# Patient Record
Sex: Female | Born: 1980 | Race: White | Hispanic: No | Marital: Married | Smoking: Never smoker
Health system: Southern US, Community
[De-identification: ages and names within clinical notes are randomized; demographics above are authoritative.]

## PROBLEM LIST (undated history)

## (undated) DIAGNOSIS — E119 Type 2 diabetes mellitus without complications: Secondary | ICD-10-CM

## (undated) DIAGNOSIS — I10 Essential (primary) hypertension: Secondary | ICD-10-CM

## (undated) DIAGNOSIS — E039 Hypothyroidism, unspecified: Secondary | ICD-10-CM

## (undated) DIAGNOSIS — K219 Gastro-esophageal reflux disease without esophagitis: Secondary | ICD-10-CM

## (undated) DIAGNOSIS — N200 Calculus of kidney: Secondary | ICD-10-CM

## (undated) DIAGNOSIS — F419 Anxiety disorder, unspecified: Secondary | ICD-10-CM

## (undated) DIAGNOSIS — E079 Disorder of thyroid, unspecified: Secondary | ICD-10-CM

## (undated) HISTORY — PX: LITHOTRIPSY: SUR834

## (undated) HISTORY — PX: FOOT SURGERY: SHX648

---
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
## 2001-02-03 ENCOUNTER — Emergency Department (HOSPITAL_COMMUNITY): Admission: EM | Admit: 2001-02-03 | Discharge: 2001-02-03 | Payer: Self-pay | Admitting: Emergency Medicine

## 2001-02-03 ENCOUNTER — Encounter: Payer: Self-pay | Admitting: Emergency Medicine

## 2001-05-10 ENCOUNTER — Emergency Department (HOSPITAL_COMMUNITY): Admission: EM | Admit: 2001-05-10 | Discharge: 2001-05-10 | Payer: Self-pay | Admitting: Emergency Medicine

## 2001-06-23 ENCOUNTER — Encounter: Payer: Self-pay | Admitting: Emergency Medicine

## 2001-06-23 ENCOUNTER — Inpatient Hospital Stay (HOSPITAL_COMMUNITY): Admission: AC | Admit: 2001-06-23 | Discharge: 2001-07-01 | Payer: Self-pay

## 2001-07-08 ENCOUNTER — Encounter: Admission: RE | Admit: 2001-07-08 | Discharge: 2001-07-08 | Payer: Self-pay | Admitting: Neurosurgery

## 2001-07-08 ENCOUNTER — Encounter: Payer: Self-pay | Admitting: Neurosurgery

## 2001-07-18 ENCOUNTER — Encounter: Admission: RE | Admit: 2001-07-18 | Discharge: 2001-07-18 | Payer: Self-pay | Admitting: Neurosurgery

## 2001-07-18 ENCOUNTER — Encounter: Payer: Self-pay | Admitting: Neurosurgery

## 2001-09-19 ENCOUNTER — Encounter: Admission: RE | Admit: 2001-09-19 | Discharge: 2001-09-19 | Payer: Self-pay | Admitting: Neurosurgery

## 2001-09-19 ENCOUNTER — Encounter: Payer: Self-pay | Admitting: Neurosurgery

## 2003-11-18 ENCOUNTER — Emergency Department (HOSPITAL_COMMUNITY): Admission: EM | Admit: 2003-11-18 | Discharge: 2003-11-18 | Payer: Self-pay | Admitting: Emergency Medicine

## 2004-01-21 ENCOUNTER — Other Ambulatory Visit: Admission: RE | Admit: 2004-01-21 | Discharge: 2004-01-21 | Payer: Self-pay | Admitting: Obstetrics and Gynecology

## 2004-11-01 ENCOUNTER — Emergency Department (HOSPITAL_COMMUNITY): Admission: EM | Admit: 2004-11-01 | Discharge: 2004-11-01 | Payer: Self-pay | Admitting: Emergency Medicine

## 2005-06-22 ENCOUNTER — Other Ambulatory Visit: Admission: RE | Admit: 2005-06-22 | Discharge: 2005-06-22 | Payer: Self-pay | Admitting: Obstetrics and Gynecology

## 2006-04-13 ENCOUNTER — Emergency Department (HOSPITAL_COMMUNITY): Admission: EM | Admit: 2006-04-13 | Discharge: 2006-04-13 | Payer: Self-pay | Admitting: Emergency Medicine

## 2006-09-01 ENCOUNTER — Emergency Department (HOSPITAL_COMMUNITY): Admission: EM | Admit: 2006-09-01 | Discharge: 2006-09-01 | Payer: Self-pay | Admitting: Emergency Medicine

## 2006-09-18 ENCOUNTER — Emergency Department (HOSPITAL_COMMUNITY): Admission: EM | Admit: 2006-09-18 | Discharge: 2006-09-18 | Payer: Self-pay | Admitting: Emergency Medicine

## 2008-01-09 ENCOUNTER — Encounter: Admission: RE | Admit: 2008-01-09 | Discharge: 2008-01-09 | Payer: Self-pay | Admitting: Family Medicine

## 2008-03-27 ENCOUNTER — Emergency Department (HOSPITAL_COMMUNITY): Admission: EM | Admit: 2008-03-27 | Discharge: 2008-03-27 | Payer: Self-pay | Admitting: Emergency Medicine

## 2008-04-05 ENCOUNTER — Emergency Department (HOSPITAL_COMMUNITY): Admission: EM | Admit: 2008-04-05 | Discharge: 2008-04-05 | Payer: Self-pay | Admitting: Emergency Medicine

## 2008-04-19 ENCOUNTER — Ambulatory Visit (HOSPITAL_COMMUNITY): Admission: RE | Admit: 2008-04-19 | Discharge: 2008-04-19 | Payer: Self-pay | Admitting: Urology

## 2009-10-30 IMAGING — CT CT PELVIS W/O CM
1 of 2 series · 13 of 32 positions shown, 19 images · non-contrast
Comparison: 03/27/2008

CT ABDOMEN

CLINICAL DATA: Worsening right flank pain.  Nausea.  Hematuria.
Recent ureteral calculus.

CT ABDOMEN AND PELVIS WITHOUT CONTRAST
TECHNIQUE: Multidetector CT imaging of the abdomen and pelvis was
performed following the standard protocol without intravenous
contrast.

[Series 4: 220 stone 4.0 b70f st · axial · 0.68mm/px · z∈[-468,-68]mm · 13 of 94 slices shown, 19 images]
[im 7/94  soft-tissue]
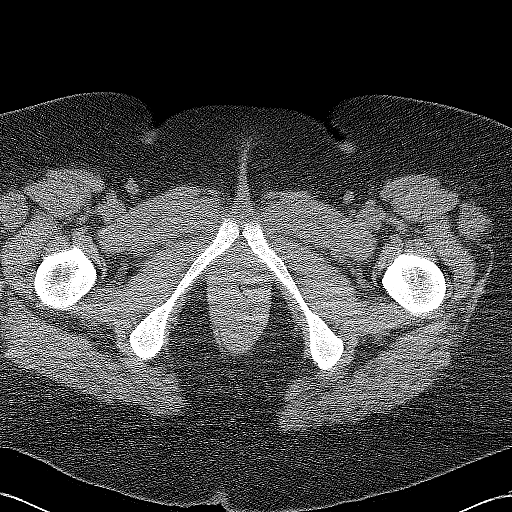
[im 7/94  bone]
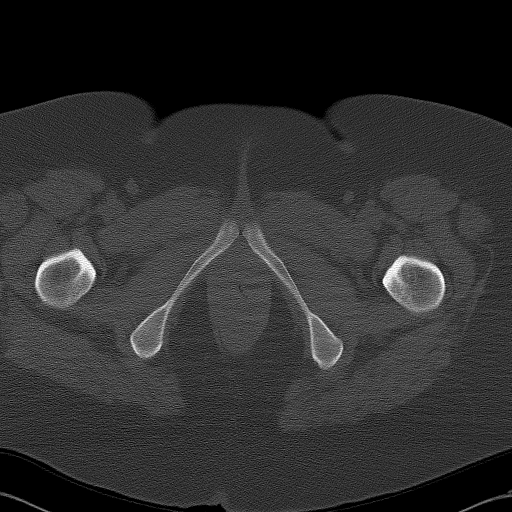
[im 13/94  soft-tissue]
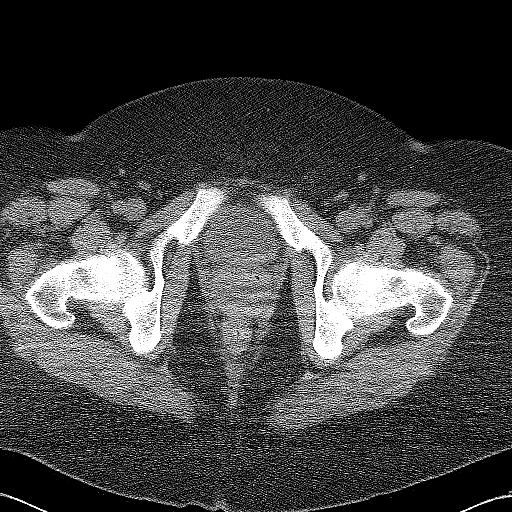
[im 19/94  soft-tissue]
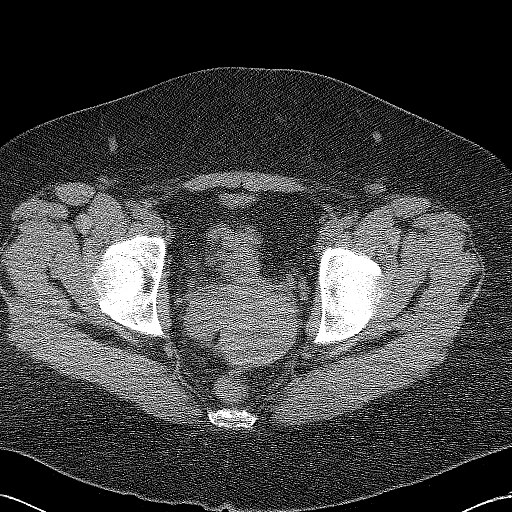
[im 25/94  soft-tissue]
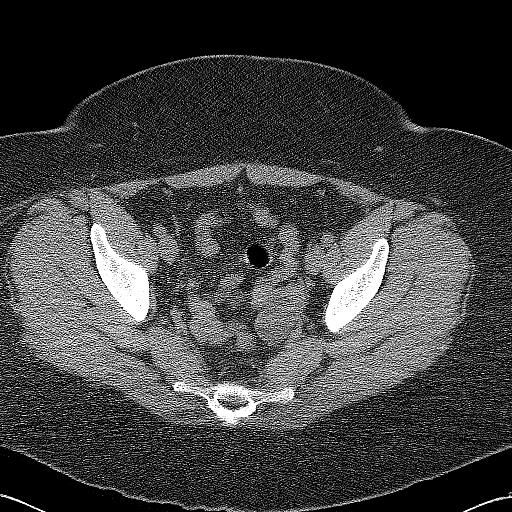
[im 32/94  soft-tissue]
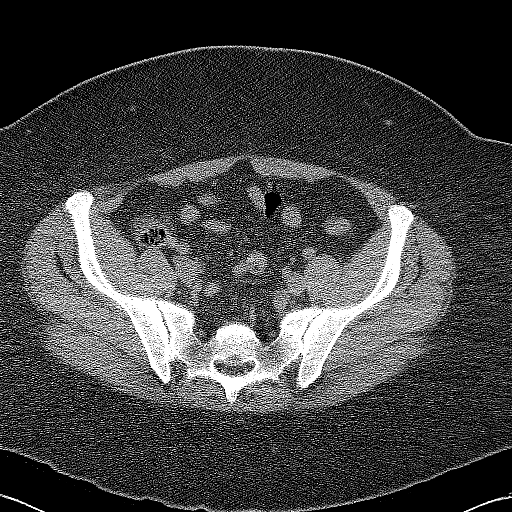
[im 38/94  soft-tissue]
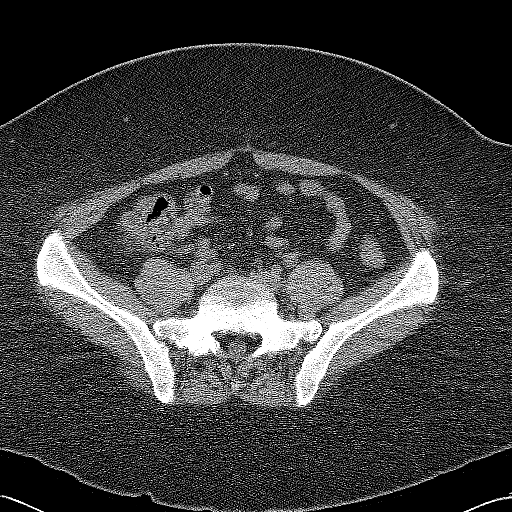
[im 50/94  soft-tissue]
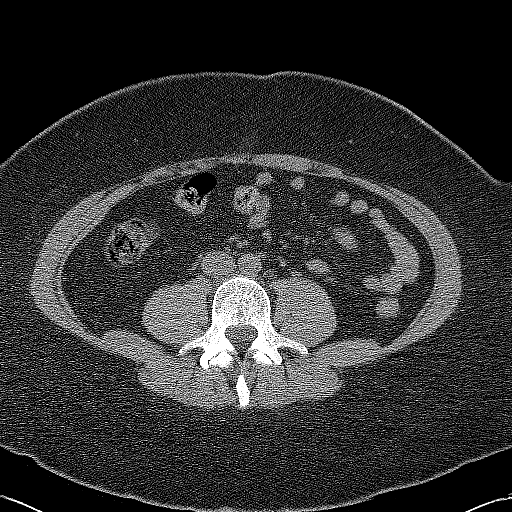
[im 56/94  soft-tissue]
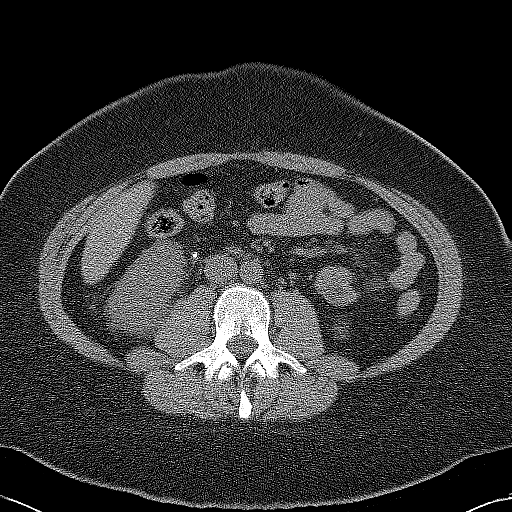
[im 63/94  soft-tissue]
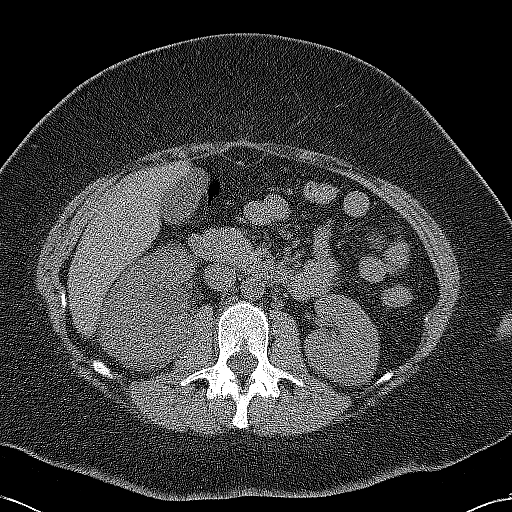
[im 63/94  bone]
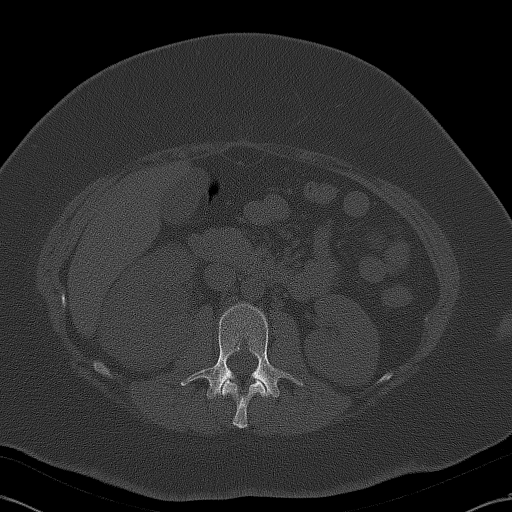
[im 69/94  soft-tissue]
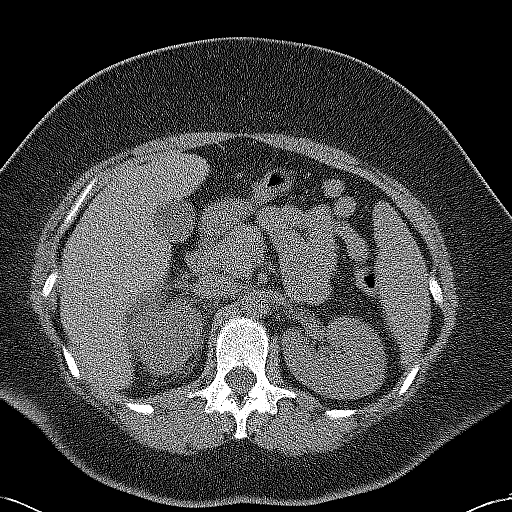
[im 69/94  lung]
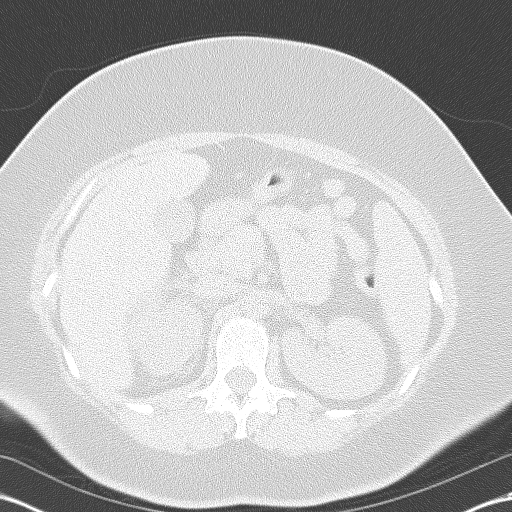
[im 75/94  soft-tissue]
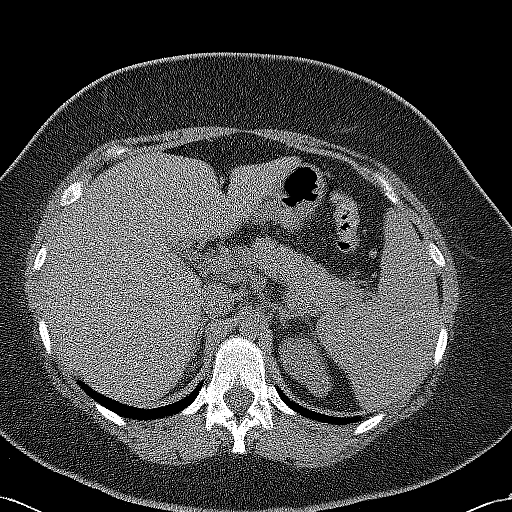
[im 75/94  lung]
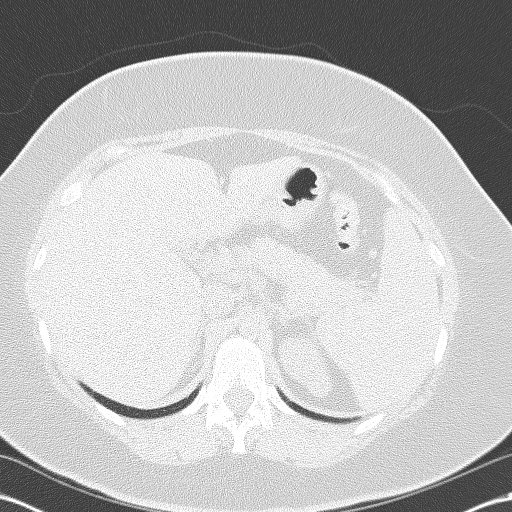
[im 81/94  soft-tissue]
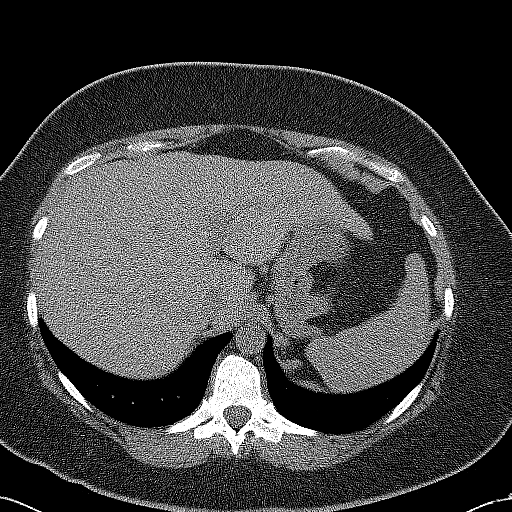
[im 81/94  lung]
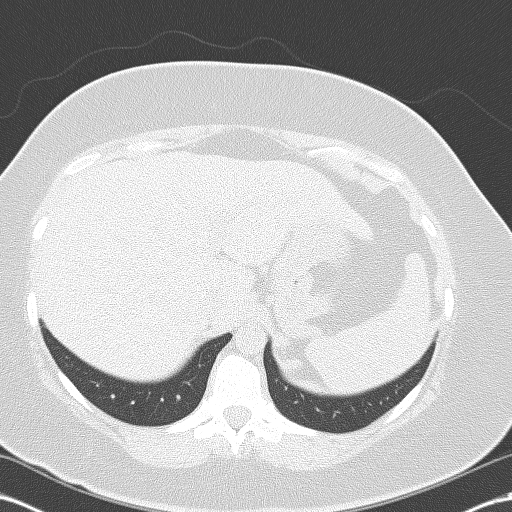
[im 87/94  soft-tissue]
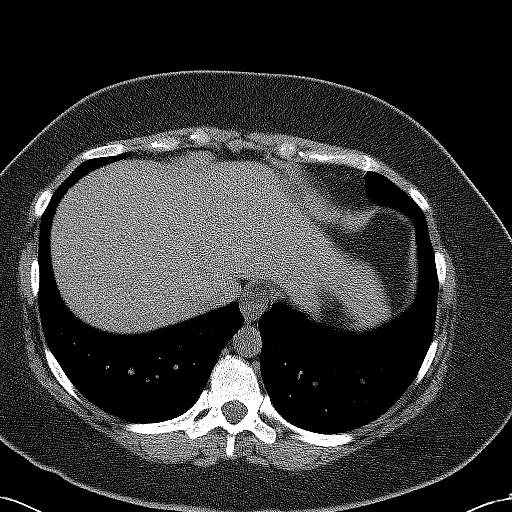
[im 87/94  lung]
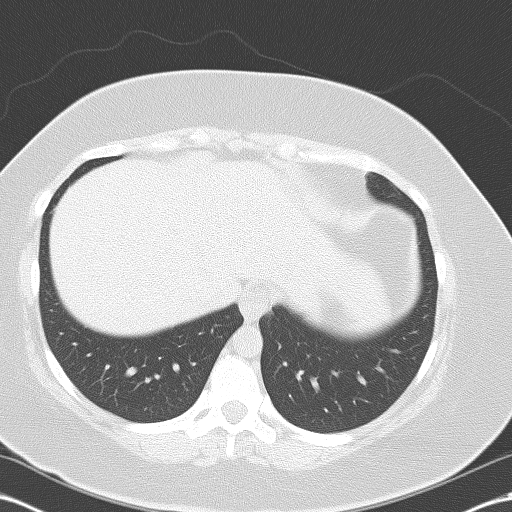

[13 of 32 positions shown; findings below may reference images not displayed]

FINDINGS: 3 mm calculus is again seen at the right ureteropelvic
junction.  Mild to moderate right sided pelvicaliectasis has not
significantly changed, although there is a new mild right
perinephric stranding.  No other urinary calculi are identified.

Noncontrast images of the other abdominal parenchymal organs are
unremarkable in appearance.
IMPRESSION: Persistent 3 mm calculus at the right UPJ.  Stable mild to moderate
right hydronephrosis, although increased right perinephric
stranding is noted.

CT PELVIS
FINDINGS: There is no evidence of distal ureteral calculi.  2.5 cm
left ovarian cyst is mildly increased in size since prior study and
likely physiologic.  There is no evidence of abscess or
inflammatory process within pelvis.  No abnormal fluid collections
are seen.
IMPRESSION: 2.5 cm left ovarian cyst is mildly increased in size since prior
study 9 days ago, and is likely physiologic.

## 2010-01-01 ENCOUNTER — Ambulatory Visit: Payer: Self-pay | Admitting: Obstetrics and Gynecology

## 2010-01-08 ENCOUNTER — Ambulatory Visit: Payer: Self-pay | Admitting: Obstetrics and Gynecology

## 2010-01-15 ENCOUNTER — Ambulatory Visit: Payer: Self-pay | Admitting: Obstetrics & Gynecology

## 2010-01-21 ENCOUNTER — Ambulatory Visit: Payer: Self-pay | Admitting: Obstetrics and Gynecology

## 2010-02-18 ENCOUNTER — Inpatient Hospital Stay (HOSPITAL_COMMUNITY): Admission: RE | Admit: 2010-02-18 | Discharge: 2010-02-21 | Payer: Self-pay | Admitting: Obstetrics and Gynecology

## 2010-02-18 ENCOUNTER — Encounter (HOSPITAL_COMMUNITY): Payer: Self-pay | Admitting: Obstetrics and Gynecology

## 2010-02-21 ENCOUNTER — Encounter
Admission: RE | Admit: 2010-02-21 | Discharge: 2010-03-10 | Payer: Self-pay | Source: Home / Self Care | Admitting: Obstetrics and Gynecology

## 2010-06-24 LAB — GLUCOSE, CAPILLARY
Glucose-Capillary: 100 mg/dL — ABNORMAL HIGH (ref 70–99)
Glucose-Capillary: 111 mg/dL — ABNORMAL HIGH (ref 70–99)
Glucose-Capillary: 120 mg/dL — ABNORMAL HIGH (ref 70–99)
Glucose-Capillary: 121 mg/dL — ABNORMAL HIGH (ref 70–99)
Glucose-Capillary: 127 mg/dL — ABNORMAL HIGH (ref 70–99)
Glucose-Capillary: 81 mg/dL (ref 70–99)
Glucose-Capillary: 84 mg/dL (ref 70–99)

## 2010-06-24 LAB — CBC
HCT: 29.8 % — ABNORMAL LOW (ref 36.0–46.0)
Hemoglobin: 10.1 g/dL — ABNORMAL LOW (ref 12.0–15.0)
Hemoglobin: 12.2 g/dL (ref 12.0–15.0)
MCH: 27.7 pg (ref 26.0–34.0)
MCHC: 33.7 g/dL (ref 30.0–36.0)
MCV: 82.1 fL (ref 78.0–100.0)
RBC: 3.62 MIL/uL — ABNORMAL LOW (ref 3.87–5.11)
RBC: 4.42 MIL/uL (ref 3.87–5.11)

## 2010-06-24 LAB — TYPE AND SCREEN: ABO/RH(D): O POS

## 2010-06-24 LAB — SURGICAL PCR SCREEN
MRSA, PCR: NEGATIVE
Staphylococcus aureus: NEGATIVE

## 2010-07-28 LAB — GLUCOSE, CAPILLARY: Glucose-Capillary: 216 mg/dL — ABNORMAL HIGH (ref 70–99)
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
## 2011-01-16 LAB — URINALYSIS, ROUTINE W REFLEX MICROSCOPIC
Bilirubin Urine: NEGATIVE
Glucose, UA: NEGATIVE mg/dL
Ketones, ur: NEGATIVE mg/dL
Leukocytes, UA: NEGATIVE
Nitrite: NEGATIVE
Protein, ur: 30 mg/dL — AB
Protein, ur: NEGATIVE mg/dL
Specific Gravity, Urine: 1.02 (ref 1.005–1.030)
Urobilinogen, UA: 0.2 mg/dL (ref 0.0–1.0)
pH: 5.5 (ref 5.0–8.0)

## 2011-01-16 LAB — CBC
HCT: 41.5 % (ref 36.0–46.0)
MCHC: 33.5 g/dL (ref 30.0–36.0)
MCV: 82.7 fL (ref 78.0–100.0)
Platelets: 245 10*3/uL (ref 150–400)
WBC: 10.7 10*3/uL — ABNORMAL HIGH (ref 4.0–10.5)

## 2011-01-16 LAB — DIFFERENTIAL
Basophils Relative: 0 % (ref 0–1)
Eosinophils Absolute: 0 10*3/uL (ref 0.0–0.7)
Eosinophils Relative: 0 % (ref 0–5)
Lymphs Abs: 1.3 10*3/uL (ref 0.7–4.0)
Neutrophils Relative %: 82 % — ABNORMAL HIGH (ref 43–77)

## 2011-01-16 LAB — URINE MICROSCOPIC-ADD ON

## 2011-01-16 LAB — URINE CULTURE: Colony Count: 10000

## 2011-01-16 LAB — BASIC METABOLIC PANEL
CO2: 25 mEq/L (ref 19–32)
Calcium: 9.6 mg/dL (ref 8.4–10.5)
Creatinine, Ser: 0.9 mg/dL (ref 0.4–1.2)
GFR calc Af Amer: >60 mL/min (ref >60)
GFR calc non Af Amer: >60 mL/min (ref >60)
Glucose, Bld: 158 mg/dL — ABNORMAL HIGH (ref 70–99)
Sodium: 139 mEq/L (ref 135–145)

## 2011-01-29 LAB — I-STAT 8, (EC8 V) (CONVERTED LAB)
BUN: 7
Glucose, Bld: 105 — ABNORMAL HIGH
Hemoglobin: 13.6
Potassium: 4.2
Sodium: 141
pH, Ven: 7.339 — ABNORMAL HIGH

## 2011-01-29 LAB — D-DIMER, QUANTITATIVE: D-Dimer, Quant: <0.22

## 2011-01-29 LAB — POCT I-STAT CREATININE
Creatinine, Ser: 0.7
Operator id: 151321

## 2011-01-29 LAB — POCT CARDIAC MARKERS: Myoglobin, poc: 57.7

## 2014-12-07 ENCOUNTER — Other Ambulatory Visit: Payer: Self-pay | Admitting: Obstetrics and Gynecology

## 2014-12-10 LAB — CYTOLOGY - PAP

## 2016-07-19 ENCOUNTER — Emergency Department
Admission: EM | Admit: 2016-07-19 | Discharge: 2016-07-19 | Disposition: A | Discharge status: Home / Self Care | Payer: Managed Care, Other (non HMO) | Source: Home / Self Care | Attending: Family Medicine | Admitting: Family Medicine

## 2016-07-19 ENCOUNTER — Encounter: Payer: Self-pay | Admitting: Emergency Medicine

## 2016-07-19 ENCOUNTER — Ambulatory Visit (HOSPITAL_BASED_OUTPATIENT_CLINIC_OR_DEPARTMENT_OTHER)
Admission: RE | Admit: 2016-07-19 | Discharge: 2016-07-19 | Disposition: A | Discharge status: Home / Self Care | Payer: Managed Care, Other (non HMO) | Source: Ambulatory Visit | Attending: Family Medicine | Admitting: Family Medicine

## 2016-07-19 DIAGNOSIS — S8991XA Unspecified injury of right lower leg, initial encounter: Secondary | ICD-10-CM | POA: Diagnosis not present

## 2016-07-19 DIAGNOSIS — M7989 Other specified soft tissue disorders: Secondary | ICD-10-CM | POA: Diagnosis present

## 2016-07-19 DIAGNOSIS — M79661 Pain in right lower leg: Secondary | ICD-10-CM | POA: Diagnosis present

## 2016-07-19 DIAGNOSIS — S8011XA Contusion of right lower leg, initial encounter: Secondary | ICD-10-CM

## 2016-07-19 HISTORY — DX: Calculus of kidney: N20.0

## 2016-07-19 HISTORY — DX: Essential (primary) hypertension: I10

## 2016-07-19 HISTORY — DX: Disorder of thyroid, unspecified: E07.9

## 2016-07-19 HISTORY — DX: Type 2 diabetes mellitus without complications: E11.9

## 2016-07-19 NOTE — ED Provider Notes (Signed)
CSN: [id number]     Arrival date & time 07/19/16  1613 History   First MD Initiated Contact with Patient 07/19/16 1635     Chief Complaint  Patient presents with  . Fall   (Consider location/radiation/quality/duration/timing/severity/associated sxs/prior Treatment) HPI  Donna Marks is a 36 y.o. female presenting to UC with c/o gradually worsening swelling and bruising from fall on floor 1 week ago while she was in [place], [place]. Pt was seen at an UC at time of incident and had negative x-rays but pain and swelling have worsened.  Pt concerned for possible blood clot. No prior hx of blood clots. She is on a baby aspirin daily but no other blood thinners.  Denies chest pain or SOB.     Past Medical History:  Diagnosis Date  . Diabetes mellitus without complication (HCC)   . Hypertension   . Kidney stones   . Thyroid disease    Past Surgical History:  Procedure Laterality Date  . FOOT SURGERY     Foreign Body Removal    History reviewed. No pertinent family history. Social History  Substance Use Topics  . Smoking status: Never Smoker  . Smokeless tobacco: Never Used  . Alcohol use No   OB History    No data available     Review of Systems  Constitutional: Negative for chills and fever.  Respiratory: Negative for shortness of breath and wheezing.   Cardiovascular: Positive for leg swelling. Negative for chest pain and palpitations.  Musculoskeletal: Positive for arthralgias and myalgias. Negative for joint swelling.  Skin: Positive for color change. Negative for wound.    Allergies  Amoxil [amoxicillin]; Ceftin [cefuroxime axetil]; and Codeine  Home Medications   Prior to Admission medications   Medication Sig Start Date End Date Taking? Authorizing Provider  aspirin 81 MG chewable tablet Chew by mouth daily.   Yes Historical Provider, MD  citalopram (CELEXA) 10 MG tablet Take 10 mg by mouth daily.   Yes Historical Provider, MD  levothyroxine (SYNTHROID, LEVOTHROID) 200  MCG tablet Take 225 mcg by mouth daily before breakfast.   Yes Historical Provider, MD  lisinopril (PRINIVIL,ZESTRIL) 10 MG tablet Take 10 mg by mouth daily.   Yes Historical Provider, MD  metFORMIN (GLUCOPHAGE) 1000 MG tablet Take 1,000 mg by mouth 2 (two) times daily with a meal.   Yes Historical Provider, MD  metoprolol succinate (TOPROL-XL) 50 MG 24 hr tablet Take 50 mg by mouth daily. Take with or immediately following a meal.   Yes Historical Provider, MD  norgestimate-ethinyl estradiol (ORTHO-CYCLEN,SPRINTEC,PREVIFEM) 0.25-35 MG-MCG tablet Take 1 tablet by mouth daily.   Yes Historical Provider, MD   Meds Ordered and Administered this Visit  Medications - No data to display  BP (!) 132/95 (BP Location: Left Arm)   Pulse 76   Temp 98.2 F (36.8 C) (Oral)   Resp 16   Ht  (1.702 m)   Wt 294 lb 8 oz (133.6 kg)   LMP 07/15/2016   SpO2 95%   BMI 46.13 kg/m  No data found.   Physical Exam  Constitutional: She is oriented to person, place, and time. She appears well-developed and well-nourished.  HENT:  Head: Normocephalic and atraumatic.  Eyes: EOM are normal.  Neck: Normal range of motion.  Cardiovascular: Normal rate.   Pulmonary/Chest: Effort normal.  Musculoskeletal: Normal range of motion. She exhibits edema and tenderness.  Right knee and lower leg: mild edema compared to Left. Tenderness to knee and anterior lower  leg. Minimal tenderness to lateral aspect. No tenderness to posterior knee or calf. Muscle compartments are soft.  Neurological: She is alert and oriented to person, place, and time.  Skin: Skin is warm and dry.  Right knee and lower leg: significant ecchymosis starting in anterior and medial knee moving down into lower leg.   Psychiatric: She has a normal mood and affect. Her behavior is normal.  Nursing note and vitals reviewed.   Urgent Care Course     Procedures (including critical care time)  Labs Review Labs Reviewed - No data to  display  Imaging Review US Venous Img Lower Unilateral Right  Result Date: 07/19/2016 CLINICAL DATA:  Right knee pain and swelling post fall 6 days ago. EXAM: Right LOWER EXTREMITY VENOUS DOPPLER ULTRASOUND TECHNIQUE: Gray-scale sonography with graded compression, as well as color Doppler and duplex ultrasound were performed to evaluate the lower extremity deep venous systems from the level of the common femoral vein and including the common femoral, femoral, profunda femoral, popliteal and calf veins including the posterior tibial, peroneal and gastrocnemius veins when visible. The superficial great saphenous vein was also interrogated. Spectral Doppler was utilized to evaluate flow at rest and with distal augmentation maneuvers in the common femoral, femoral and popliteal veins. COMPARISON:  None. FINDINGS: Contralateral Common Femoral Vein: Respiratory phasicity is normal and symmetric with the symptomatic side. No evidence of thrombus. Normal compressibility. Common Femoral Vein: No evidence of thrombus. Normal compressibility, respiratory phasicity and response to augmentation. Saphenofemoral Junction: No evidence of thrombus. Normal compressibility and flow on color Doppler imaging. Profunda Femoral Vein: No evidence of thrombus. Normal compressibility and flow on color Doppler imaging. Femoral Vein: No evidence of thrombus. Normal compressibility, respiratory phasicity and response to augmentation. Popliteal Vein: No evidence of thrombus. Normal compressibility, respiratory phasicity and response to augmentation. Calf Veins: No evidence of thrombus. Normal compressibility and flow on color Doppler imaging. Superficial Great Saphenous Vein: No evidence of thrombus. Normal compressibility and flow on color Doppler imaging. Venous Reflux:  None. Other Findings: No focal fluid collection over the area of patient's anterior right knee pain/bruising. IMPRESSION: No evidence of deep venous thrombosis.  Electronically Signed   By: Elberta Fortis M.D.   On: 07/19/2016 17:31     MDM   1. Right leg injury, initial encounter   2. Traumatic ecchymosis of right lower leg, initial encounter   3. Pain and swelling of right lower leg    Pt sent to [facility] for U/S, however, low concern for DVT given no posterior leg tenderness. Soft muscle compartments.   Significant bruising could be due to a combination of pt being fair skin, gravity, and possible underlying soft tissue injury such as ligament or meniscus.    U/S RLE: Negative for DVT  Encouraged rest, warm compresses to help with bruising and circulation.  Elevation to help with swelling. f/u with PCP as needed.      Junius Finner, PA-C 07/19/16 1740

## 2016-07-19 NOTE — ED Triage Notes (Signed)
Patient presents to [facility] with C/O pain , swelling and bruising after a fall on Monday. Patient states she was seen at an urgent care on Monday for an x-ray. She said negative for a fracture. brusing is worse and pain continues.

## 2018-12-31 IMAGING — US US EXTREM LOW VENOUS*R*
1 series · 13 of 24 positions shown · non-contrast
Comparison: None.

CLINICAL DATA: Right knee pain and swelling post fall 6 days ago.



[Series 1: us extrem low venous*right* · 0.10mm/px · 13 of 38 slices shown]
[im 1/38]
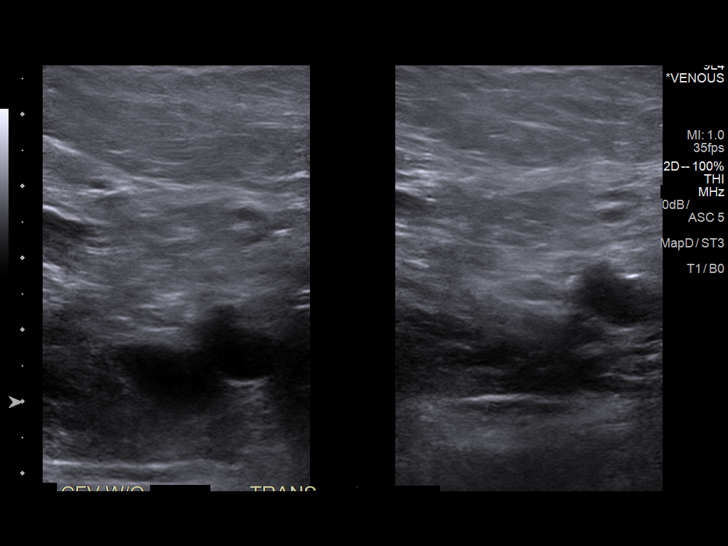
[im 4/38]
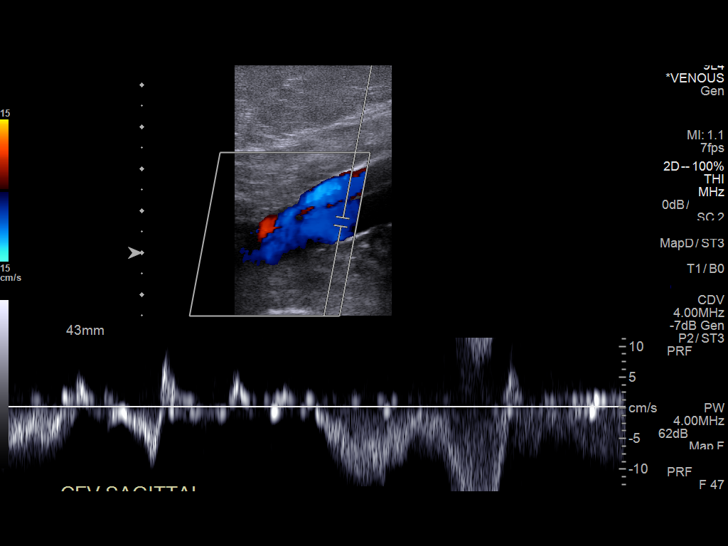
[im 7/38]
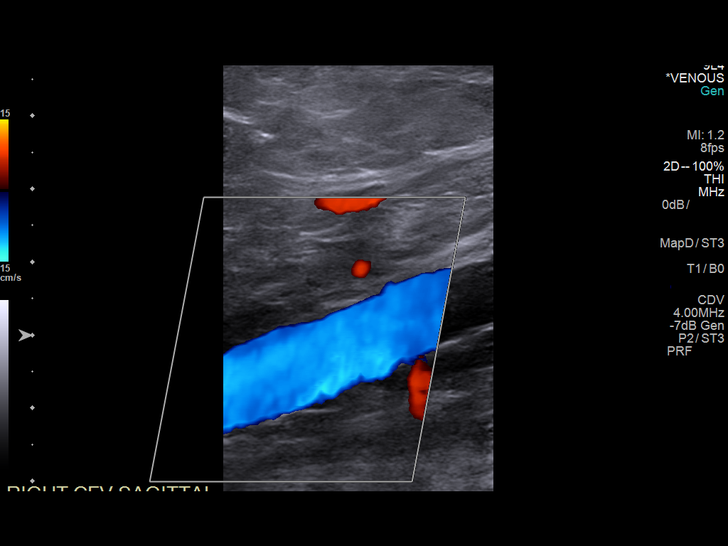
[im 10/38]
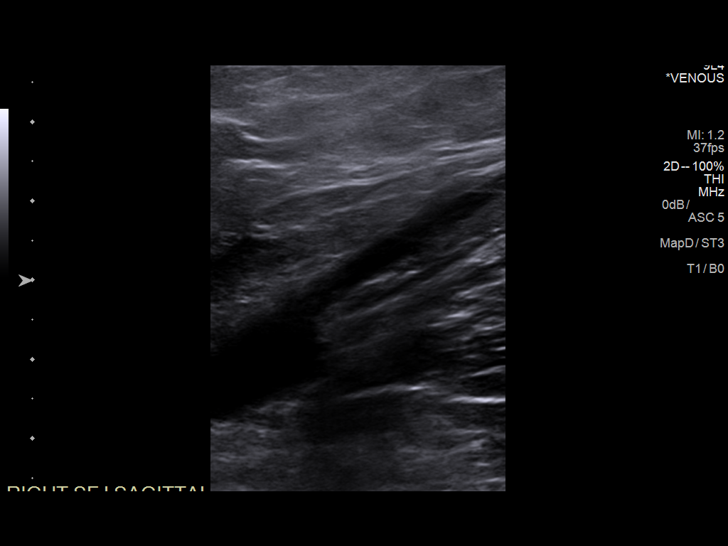
[im 13/38]
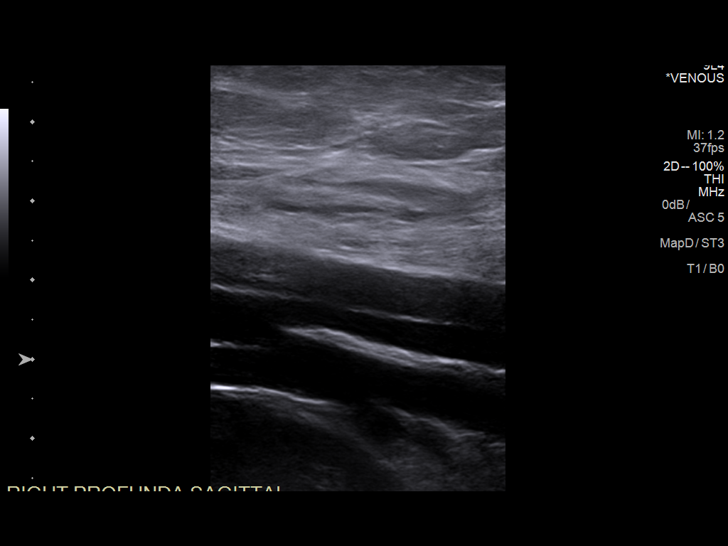
[im 17/38]
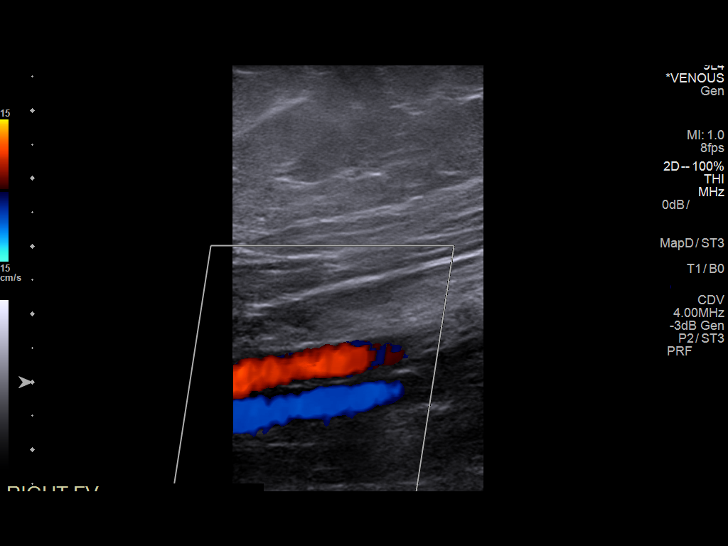
[im 20/38]
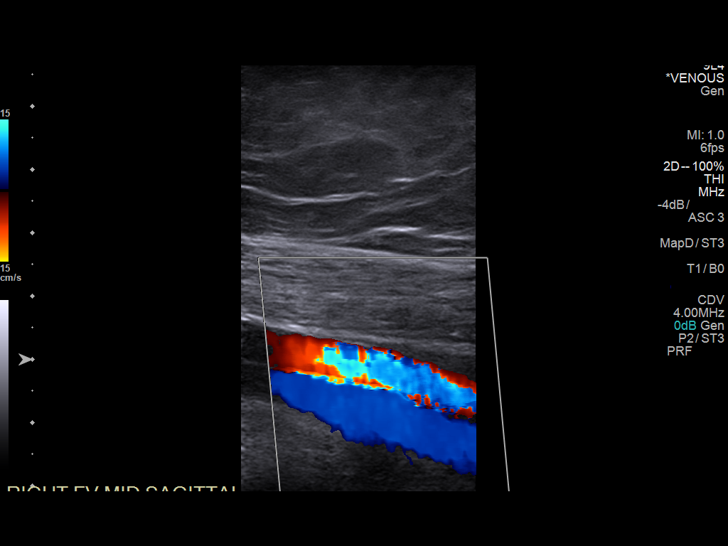
[im 21/38]
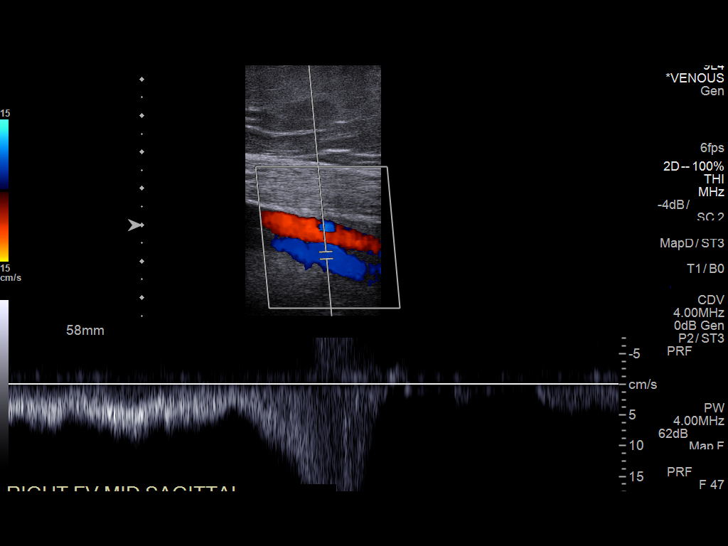
[im 25/38]
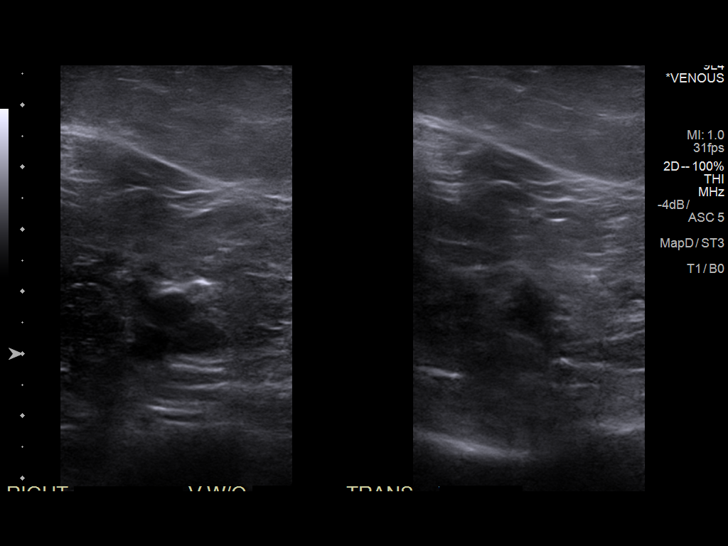
[im 28/38]
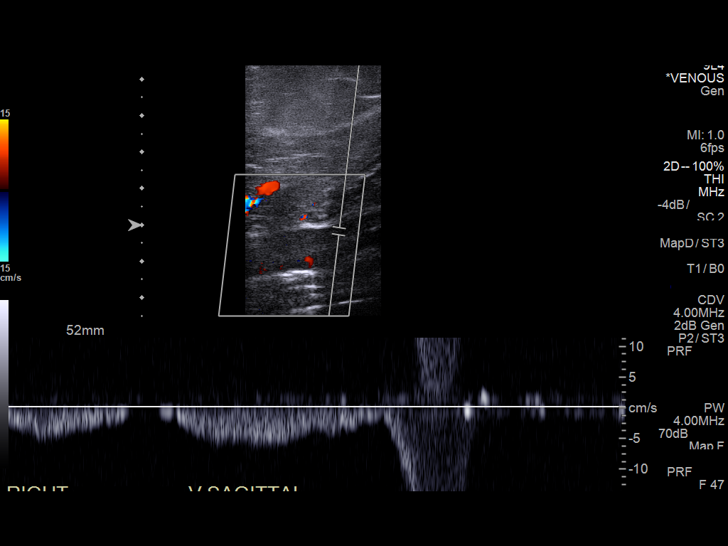
[im 31/38]
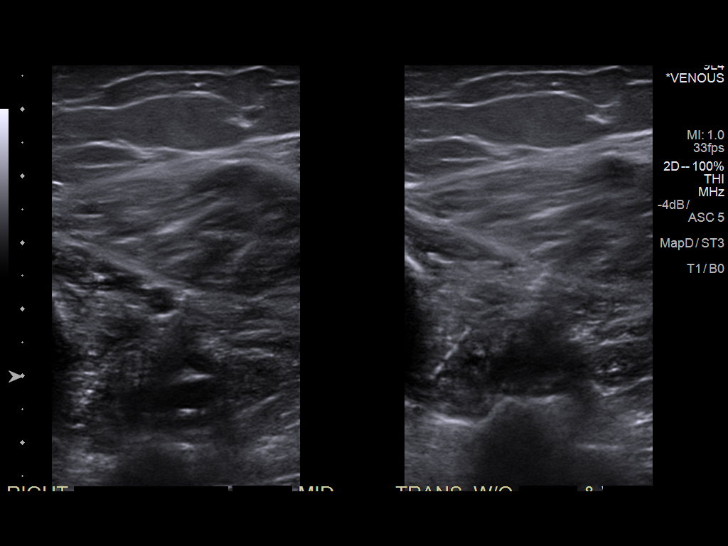
[im 34/38]
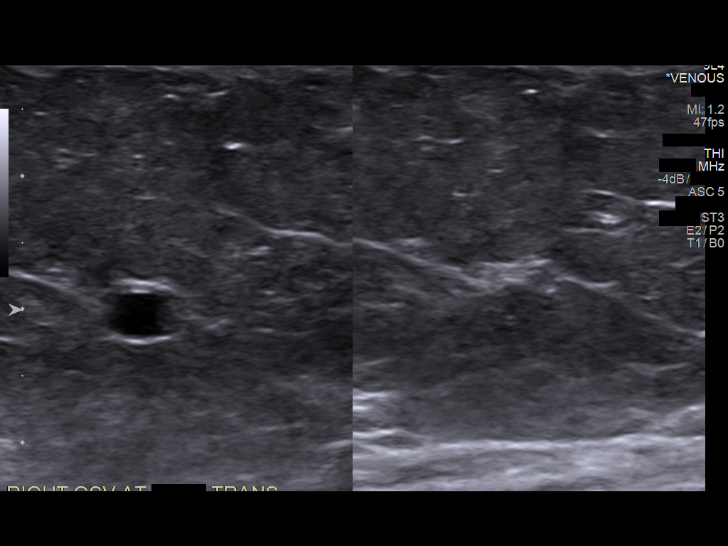
[im 38/38]
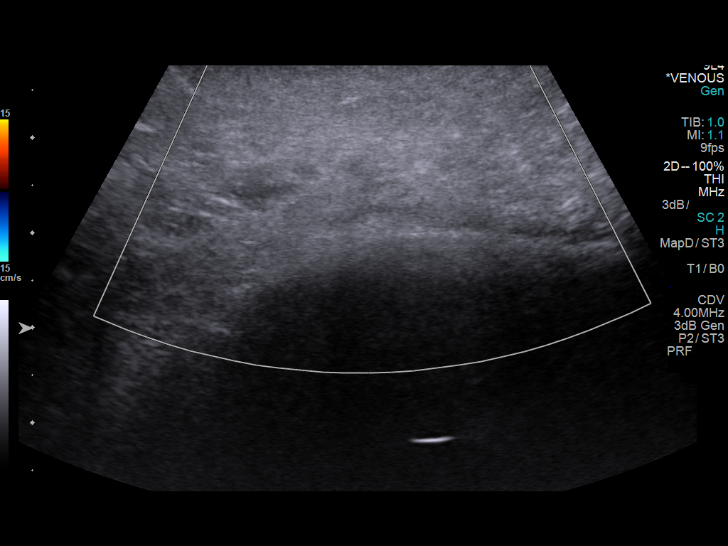

[13 of 24 positions shown; findings below may reference images not displayed]

FINDINGS: Contralateral Common Femoral Vein: Respiratory phasicity is normal
and symmetric with the symptomatic side. No evidence of thrombus.
Normal compressibility.

Common Femoral Vein: No evidence of thrombus. Normal
compressibility, respiratory phasicity and response to augmentation.

Saphenofemoral Junction: No evidence of thrombus. Normal
compressibility and flow on color Doppler imaging.

Profunda Femoral Vein: No evidence of thrombus. Normal
compressibility and flow on color Doppler imaging.

Femoral Vein: No evidence of thrombus. Normal compressibility,
respiratory phasicity and response to augmentation.

Popliteal Vein: No evidence of thrombus. Normal compressibility,
respiratory phasicity and response to augmentation.

Calf Veins: No evidence of thrombus. Normal compressibility and flow
on color Doppler imaging.

Superficial Great Saphenous Vein: No evidence of thrombus. Normal
compressibility and flow on color Doppler imaging.

Venous Reflux:  None.

Other Findings: No focal fluid collection over the area of patient's
anterior right knee pain/bruising.
IMPRESSION: No evidence of deep venous thrombosis.

## 2019-02-02 LAB — OB RESULTS CONSOLE ABO/RH: RH Type: POSITIVE

## 2019-02-02 LAB — OB RESULTS CONSOLE RPR: RPR: NONREACTIVE

## 2019-02-02 LAB — OB RESULTS CONSOLE ANTIBODY SCREEN: Antibody Screen: NEGATIVE

## 2019-02-02 LAB — OB RESULTS CONSOLE RUBELLA ANTIBODY, IGM: Rubella: IMMUNE

## 2019-02-02 LAB — OB RESULTS CONSOLE HIV ANTIBODY (ROUTINE TESTING): HIV: NONREACTIVE

## 2019-02-02 LAB — OB RESULTS CONSOLE GC/CHLAMYDIA
Chlamydia: NEGATIVE
Gonorrhea: NEGATIVE

## 2019-02-02 LAB — OB RESULTS CONSOLE HEPATITIS B SURFACE ANTIGEN: Hepatitis B Surface Ag: NEGATIVE

## 2019-08-07 NOTE — H&P (Signed)
Donna Marks is a 39 y.o. female presenting for scheduled repeat cesarean section with bilateral tubal ligation. Pregnancy c/b morbid obesity, thyroid disease, cHTN, and T2DM. She has suspected macrosomia. DM did have very poor control until re-establishing care with endocrinology at that time BS was improved. Last [place] at 46 wga demonstrated EFW of 100% of 8#1.HTN c/w Labetalol 200mg  BID and has been well controlled. Thyroid disease d/w Levothyroxine 250 mcg.  She has been counseled regarding risks of all of the above. Expecting a boy.    OB History   No obstetric history on file.    Past Medical History:  Diagnosis Date  . Diabetes mellitus without complication (HCC)   . Hypertension   . Kidney stones   . Thyroid disease    Past Surgical History:  Procedure Laterality Date  . FOOT SURGERY     Foreign Body Removal   Cesarean section  Family History: family history is not on file. Social History:  reports that she has never smoked. She has never used smokeless tobacco. She reports that she does not drink alcohol. No history on file for drug.     Maternal Diabetes: Yes:  Diabetes Type:  Insulin/Medication controlled Genetic Screening: Normal Maternal Ultrasounds/Referrals: Other: Fetal Ultrasounds or other Referrals:  Fetal echo Maternal Substance Abuse:  No Significant Maternal Medications:  Meds include: Other: labetalol, levothyroxine, metformin, and insulin Significant Maternal Lab Results:  None Other Comments:  None  Review of Systems History   There were no vitals taken for this visit. Exam Physical Exam  (from office) NAD, A&O NWOB Abd soft, nondistended, gravid   Prenatal labs: ABO, Rh:   Antibody:   Rubella:   RPR:    HBsAg:    HIV:    GBS:     Assessment/Plan:  38 yo presenting for scheduled repeat CS with requested bilateral tubal ligation. Risks discussed including infection, bleeding, damage to surrounding structures, the need for additional  procedures including hysterectomy, and the possibility of uterine rupture with neonatal morbidity/mortality, scarring, and abnormal placentation with subsequent pregnancies. We also discussed bilateral tubal ligation in detail. The alternatives to permanent sterilization were reviewed and it was discussed that this is considered permanent. We reviewed the risks in detail including regret, failure and ectopic pregnancy. She understands and agrees to proceed. Gent/xlinda on call to OR.      26 08/07/2019, 1:59 PM

## 2019-08-17 ENCOUNTER — Encounter (HOSPITAL_COMMUNITY): Payer: Self-pay | Admitting: *Deleted

## 2019-08-17 ENCOUNTER — Telehealth (HOSPITAL_COMMUNITY): Payer: Self-pay | Admitting: *Deleted

## 2019-08-17 NOTE — Telephone Encounter (Signed)
Preadmission screen  

## 2019-08-21 ENCOUNTER — Encounter (HOSPITAL_COMMUNITY): Payer: Self-pay

## 2019-08-21 ENCOUNTER — Telehealth (HOSPITAL_COMMUNITY): Payer: Self-pay | Admitting: *Deleted

## 2019-08-21 NOTE — Telephone Encounter (Signed)
Preadmission screen  

## 2019-08-22 ENCOUNTER — Encounter (HOSPITAL_COMMUNITY): Payer: Self-pay

## 2019-08-22 NOTE — Patient Instructions (Signed)
ANNAELLE KASEL  08/22/2019   Your procedure is scheduled on:  08/31/2019  Arrive at 0530 at Entrance C on [address] at [facility]  and [address]. You are invited to use the FREE valet parking or use the Visitor's parking deck.  Pick up the phone at the desk and dial [phone].  Call this number if you have problems the morning of surgery: [phone]  Remember:   Do not eat food:(After Midnight) Desps de medianoche.  Do not drink clear liquids: (After Midnight) Desps de medianoche.  Take these medicines the morning of surgery with A SIP OF WATER:  Do not take metformin the day before or day of surgery            Take 35units of insulin at bedtime instead of 70.            Take your celexa synthroid and labetalol as prescribed.   Do not wear jewelry, make-up or nail polish.  Do not wear lotions, powders, or perfumes. Do not wear deodorant.  Do not shave 48 hours prior to surgery.  Do not bring valuables to the hospital.  [facility] is not   responsible for any belongings or valuables brought to the hospital.  Contacts, dentures or bridgework may not be worn into surgery.  Leave suitcase in the car. After surgery it may be brought to your room.  For patients admitted to the hospital, checkout time is 11:00 AM the day of              discharge.      Please read over the following fact sheets that you were given:     Preparing for Surgery

## 2019-08-29 ENCOUNTER — Other Ambulatory Visit: Payer: Self-pay

## 2019-08-29 ENCOUNTER — Other Ambulatory Visit (HOSPITAL_COMMUNITY)
Admission: RE | Admit: 2019-08-29 | Discharge: 2019-08-29 | Disposition: A | Discharge status: Home / Self Care | Payer: Managed Care, Other (non HMO) | Source: Ambulatory Visit | Attending: Obstetrics and Gynecology | Admitting: Obstetrics and Gynecology

## 2019-08-29 DIAGNOSIS — Z01812 Encounter for preprocedural laboratory examination: Secondary | ICD-10-CM | POA: Insufficient documentation

## 2019-08-29 DIAGNOSIS — Z20822 Contact with and (suspected) exposure to covid-19: Secondary | ICD-10-CM | POA: Insufficient documentation

## 2019-08-29 HISTORY — DX: Anxiety disorder, unspecified: F41.9

## 2019-08-29 HISTORY — DX: Gastro-esophageal reflux disease without esophagitis: K21.9

## 2019-08-29 HISTORY — DX: Hypothyroidism, unspecified: E03.9

## 2019-08-29 LAB — ABO/RH: ABO/RH(D): O POS

## 2019-08-29 LAB — SARS CORONAVIRUS 2 (TAT 6-24 HRS): SARS Coronavirus 2: NEGATIVE

## 2019-08-29 LAB — CBC
HCT: 37.1 % (ref 36.0–46.0)
Hemoglobin: 11.9 g/dL — ABNORMAL LOW (ref 12.0–15.0)
MCH: 26.6 pg (ref 26.0–34.0)
MCHC: 32.1 g/dL (ref 30.0–36.0)
MCV: 82.8 fL (ref 80.0–100.0)
Platelets: 162 10*3/uL (ref 150–400)
RBC: 4.48 MIL/uL (ref 3.87–5.11)
RDW: 17.8 % — ABNORMAL HIGH (ref 11.5–15.5)
WBC: 5.5 10*3/uL (ref 4.0–10.5)
nRBC: 0 % (ref 0.0–0.2)

## 2019-08-29 LAB — TYPE AND SCREEN
ABO/RH(D): O POS
Antibody Screen: NEGATIVE

## 2019-08-29 LAB — RPR: RPR Ser Ql: NONREACTIVE

## 2019-08-29 NOTE — MAU Note (Signed)
Asymptomatic, swab collected. Lab called 

## 2019-08-31 ENCOUNTER — Inpatient Hospital Stay (HOSPITAL_COMMUNITY)
Admission: RE | Admit: 2019-08-31 | Discharge: 2019-09-02 | DRG: 783 | Disposition: A | Discharge status: Home / Self Care | Payer: Managed Care, Other (non HMO) | Attending: Obstetrics and Gynecology | Admitting: Obstetrics and Gynecology

## 2019-08-31 ENCOUNTER — Inpatient Hospital Stay (HOSPITAL_COMMUNITY)
Admission: AD | Admit: 2019-08-31 | Payer: Managed Care, Other (non HMO) | Source: Home / Self Care | Admitting: Obstetrics and Gynecology

## 2019-08-31 ENCOUNTER — Encounter (HOSPITAL_COMMUNITY): Payer: Self-pay | Admitting: Obstetrics and Gynecology

## 2019-08-31 ENCOUNTER — Other Ambulatory Visit: Payer: Self-pay

## 2019-08-31 ENCOUNTER — Encounter (HOSPITAL_COMMUNITY): Payer: Managed Care, Other (non HMO)

## 2019-08-31 ENCOUNTER — Encounter (HOSPITAL_COMMUNITY)
Admission: RE | Disposition: A | Discharge status: Home / Self Care | Payer: Self-pay | Source: Home / Self Care | Attending: Obstetrics and Gynecology

## 2019-08-31 ENCOUNTER — Inpatient Hospital Stay (HOSPITAL_COMMUNITY): Payer: Managed Care, Other (non HMO)

## 2019-08-31 DIAGNOSIS — Z3A38 38 weeks gestation of pregnancy: Secondary | ICD-10-CM | POA: Diagnosis not present

## 2019-08-31 DIAGNOSIS — O2412 Pre-existing diabetes mellitus, type 2, in childbirth: Secondary | ICD-10-CM | POA: Diagnosis present

## 2019-08-31 DIAGNOSIS — O3663X0 Maternal care for excessive fetal growth, third trimester, not applicable or unspecified: Secondary | ICD-10-CM | POA: Diagnosis present

## 2019-08-31 DIAGNOSIS — E1165 Type 2 diabetes mellitus with hyperglycemia: Secondary | ICD-10-CM | POA: Diagnosis present

## 2019-08-31 DIAGNOSIS — O34211 Maternal care for low transverse scar from previous cesarean delivery: Principal | ICD-10-CM | POA: Diagnosis present

## 2019-08-31 DIAGNOSIS — E079 Disorder of thyroid, unspecified: Secondary | ICD-10-CM | POA: Diagnosis present

## 2019-08-31 DIAGNOSIS — O99284 Endocrine, nutritional and metabolic diseases complicating childbirth: Secondary | ICD-10-CM | POA: Diagnosis present

## 2019-08-31 DIAGNOSIS — O99214 Obesity complicating childbirth: Secondary | ICD-10-CM | POA: Diagnosis present

## 2019-08-31 DIAGNOSIS — O1002 Pre-existing essential hypertension complicating childbirth: Secondary | ICD-10-CM | POA: Diagnosis present

## 2019-08-31 DIAGNOSIS — Z20822 Contact with and (suspected) exposure to covid-19: Secondary | ICD-10-CM | POA: Diagnosis present

## 2019-08-31 DIAGNOSIS — Z794 Long term (current) use of insulin: Secondary | ICD-10-CM | POA: Diagnosis not present

## 2019-08-31 DIAGNOSIS — Z3493 Encounter for supervision of normal pregnancy, unspecified, third trimester: Secondary | ICD-10-CM

## 2019-08-31 DIAGNOSIS — Z302 Encounter for sterilization: Secondary | ICD-10-CM | POA: Diagnosis not present

## 2019-08-31 LAB — GLUCOSE, CAPILLARY
Glucose-Capillary: 105 mg/dL — ABNORMAL HIGH (ref 70–99)
Glucose-Capillary: 113 mg/dL — ABNORMAL HIGH (ref 70–99)
Glucose-Capillary: 112 mg/dL — ABNORMAL HIGH (ref 70–99)
Glucose-Capillary: 119 mg/dL — ABNORMAL HIGH (ref 70–99)

## 2019-08-31 LAB — HEMOGLOBIN A1C
Hgb A1c MFr Bld: 6.7 % — ABNORMAL HIGH (ref 4.8–5.6)
Mean Plasma Glucose: 145.59 mg/dL

## 2019-08-31 SURGERY — Surgical Case
Anesthesia: Spinal

## 2019-08-31 MED ORDER — PHENYLEPHRINE HCL-NACL 20-0.9 MG/250ML-% IV SOLN
INTRAVENOUS | Status: DC | PRN
  Administered 2019-08-31: 60 ug/min via INTRAVENOUS

## 2019-08-31 MED ORDER — OXYCODONE HCL 5 MG PO TABS: 5.0000 mg | ORAL_TABLET | Freq: Once | ORAL | Status: DC | PRN

## 2019-08-31 MED ORDER — HYDROMORPHONE HCL 1 MG/ML IJ SOLN: 0.2000 mg | INTRAMUSCULAR | Status: DC | PRN

## 2019-08-31 MED ORDER — INSULIN DETEMIR 100 UNIT/ML ~~LOC~~ SOLN
20.0000 [IU] | Freq: Every day | SUBCUTANEOUS | Status: DC
  Administered 2019-08-31 – 2019-09-01 (×2): 20 [IU] via SUBCUTANEOUS
  Filled 2019-08-31 (×3): qty 0.2

## 2019-08-31 MED ORDER — LACTATED RINGERS IV SOLN: INTRAVENOUS | Status: DC

## 2019-08-31 MED ORDER — PROMETHAZINE HCL 25 MG/ML IJ SOLN: 6.2500 mg | INTRAMUSCULAR | Status: DC | PRN

## 2019-08-31 MED ORDER — NALOXONE HCL 0.4 MG/ML IJ SOLN: 0.4000 mg | INTRAMUSCULAR | Status: DC | PRN

## 2019-08-31 MED ORDER — LEVOTHYROXINE SODIUM 50 MCG PO TABS
250.0000 ug | ORAL_TABLET | Freq: Every day | ORAL | Status: DC
  Administered 2019-09-01 – 2019-09-02 (×2): 250 ug via ORAL
  Filled 2019-08-31 (×2): qty 1

## 2019-08-31 MED ORDER — ACETAMINOPHEN 325 MG PO TABS
650.0000 mg | ORAL_TABLET | ORAL | Status: DC | PRN
  Administered 2019-09-01 (×2): 650 mg via ORAL
  Filled 2019-08-31 (×2): qty 2

## 2019-08-31 MED ORDER — OXYTOCIN 40 UNITS IN NORMAL SALINE INFUSION - SIMPLE MED: 2.5000 [IU]/h | INTRAVENOUS | Status: AC

## 2019-08-31 MED ORDER — ONDANSETRON HCL 4 MG/2ML IJ SOLN
INTRAMUSCULAR | Status: DC | PRN
  Administered 2019-08-31: 4 mg via INTRAVENOUS

## 2019-08-31 MED ORDER — COCONUT OIL OIL: 1.0000 "application " | TOPICAL_OIL | Status: DC | PRN

## 2019-08-31 MED ORDER — CITALOPRAM HYDROBROMIDE 20 MG PO TABS
20.0000 mg | ORAL_TABLET | Freq: Every day | ORAL | Status: DC
  Administered 2019-09-01 – 2019-09-02 (×2): 20 mg via ORAL
  Filled 2019-08-31 (×2): qty 1

## 2019-08-31 MED ORDER — DIPHENHYDRAMINE HCL 25 MG PO CAPS: 25.0000 mg | ORAL_CAPSULE | ORAL | Status: DC | PRN

## 2019-08-31 MED ORDER — FENTANYL CITRATE (PF) 100 MCG/2ML IJ SOLN
INTRAMUSCULAR | Status: AC
  Filled 2019-08-31: qty 2

## 2019-08-31 MED ORDER — SIMETHICONE 80 MG PO CHEW: 80.0000 mg | CHEWABLE_TABLET | ORAL | Status: DC | PRN

## 2019-08-31 MED ORDER — NALBUPHINE HCL 10 MG/ML IJ SOLN: 5.0000 mg | Freq: Once | INTRAMUSCULAR | Status: DC | PRN

## 2019-08-31 MED ORDER — GENTAMICIN SULFATE 40 MG/ML IJ SOLN
5.0000 mg/kg | INTRAVENOUS | Status: AC
  Administered 2019-08-31: 450 mg via INTRAVENOUS
  Filled 2019-08-31: qty 11.25

## 2019-08-31 MED ORDER — CLINDAMYCIN PHOSPHATE 900 MG/50ML IV SOLN
INTRAVENOUS | Status: AC
  Filled 2019-08-31: qty 50

## 2019-08-31 MED ORDER — NALBUPHINE HCL 10 MG/ML IJ SOLN: 5.0000 mg | INTRAMUSCULAR | Status: DC | PRN

## 2019-08-31 MED ORDER — PHENYLEPHRINE HCL (PRESSORS) 10 MG/ML IV SOLN
INTRAVENOUS | Status: DC | PRN
  Administered 2019-08-31: 80 ug via INTRAVENOUS
  Administered 2019-08-31: 40 ug via INTRAVENOUS
  Administered 2019-08-31: 80 ug via INTRAVENOUS

## 2019-08-31 MED ORDER — MORPHINE SULFATE (PF) 0.5 MG/ML IJ SOLN
INTRAMUSCULAR | Status: AC
  Filled 2019-08-31: qty 10

## 2019-08-31 MED ORDER — FENTANYL CITRATE (PF) 100 MCG/2ML IJ SOLN
INTRAMUSCULAR | Status: DC | PRN
  Administered 2019-08-31: 15 ug via INTRATHECAL

## 2019-08-31 MED ORDER — WITCH HAZEL-GLYCERIN EX PADS: 1.0000 "application " | MEDICATED_PAD | CUTANEOUS | Status: DC | PRN

## 2019-08-31 MED ORDER — SODIUM CHLORIDE 0.9 % IR SOLN
Status: DC | PRN
  Administered 2019-08-31: 1000 mL

## 2019-08-31 MED ORDER — SENNOSIDES-DOCUSATE SODIUM 8.6-50 MG PO TABS
2.0000 | ORAL_TABLET | ORAL | Status: DC
  Administered 2019-08-31 – 2019-09-01 (×2): 2 via ORAL
  Filled 2019-08-31 (×2): qty 2

## 2019-08-31 MED ORDER — SIMETHICONE 80 MG PO CHEW
80.0000 mg | CHEWABLE_TABLET | Freq: Three times a day (TID) | ORAL | Status: DC
  Administered 2019-09-01 – 2019-09-02 (×4): 80 mg via ORAL
  Filled 2019-08-31 (×5): qty 1

## 2019-08-31 MED ORDER — SOD CITRATE-CITRIC ACID 500-334 MG/5ML PO SOLN
30.0000 mL | ORAL | Status: AC
  Administered 2019-08-31: 30 mL via ORAL

## 2019-08-31 MED ORDER — MORPHINE SULFATE (PF) 0.5 MG/ML IJ SOLN
INTRAMUSCULAR | Status: DC | PRN
  Administered 2019-08-31: 150 ug via INTRATHECAL

## 2019-08-31 MED ORDER — KETOROLAC TROMETHAMINE 30 MG/ML IJ SOLN
INTRAMUSCULAR | Status: AC
  Filled 2019-08-31: qty 1

## 2019-08-31 MED ORDER — OXYTOCIN 40 UNITS IN NORMAL SALINE INFUSION - SIMPLE MED
INTRAVENOUS | Status: AC
  Filled 2019-08-31: qty 1000

## 2019-08-31 MED ORDER — OXYTOCIN 40 UNITS IN NORMAL SALINE INFUSION - SIMPLE MED
INTRAVENOUS | Status: DC | PRN
Start: 2019-08-31 — End: 2019-08-31
  Administered 2019-08-31: 40 [IU] via INTRAVENOUS

## 2019-08-31 MED ORDER — DIPHENHYDRAMINE HCL 25 MG PO CAPS: 25.0000 mg | ORAL_CAPSULE | Freq: Four times a day (QID) | ORAL | Status: DC | PRN

## 2019-08-31 MED ORDER — MEPERIDINE HCL 25 MG/ML IJ SOLN: 6.2500 mg | INTRAMUSCULAR | Status: DC | PRN

## 2019-08-31 MED ORDER — LABETALOL HCL 200 MG PO TABS
200.0000 mg | ORAL_TABLET | Freq: Two times a day (BID) | ORAL | Status: DC
  Administered 2019-09-01 – 2019-09-02 (×3): 200 mg via ORAL
  Filled 2019-08-31 (×3): qty 1

## 2019-08-31 MED ORDER — SOD CITRATE-CITRIC ACID 500-334 MG/5ML PO SOLN
ORAL | Status: AC
  Filled 2019-08-31: qty 30

## 2019-08-31 MED ORDER — STERILE WATER FOR IRRIGATION IR SOLN
Status: DC | PRN
  Administered 2019-08-31: 1000 mL

## 2019-08-31 MED ORDER — ONDANSETRON HCL 4 MG/2ML IJ SOLN: 4.0000 mg | Freq: Three times a day (TID) | INTRAMUSCULAR | Status: DC | PRN

## 2019-08-31 MED ORDER — ZOLPIDEM TARTRATE 5 MG PO TABS: 5.0000 mg | ORAL_TABLET | Freq: Every evening | ORAL | Status: DC | PRN

## 2019-08-31 MED ORDER — ONDANSETRON HCL 4 MG/2ML IJ SOLN
INTRAMUSCULAR | Status: AC
  Filled 2019-08-31: qty 2

## 2019-08-31 MED ORDER — IBUPROFEN 600 MG PO TABS
600.0000 mg | ORAL_TABLET | Freq: Four times a day (QID) | ORAL | Status: DC | PRN
  Administered 2019-09-01 – 2019-09-02 (×4): 600 mg via ORAL
  Filled 2019-08-31 (×4): qty 1

## 2019-08-31 MED ORDER — MENTHOL 3 MG MT LOZG: 1.0000 | LOZENGE | OROMUCOSAL | Status: DC | PRN

## 2019-08-31 MED ORDER — CLINDAMYCIN PHOSPHATE 900 MG/50ML IV SOLN
900.0000 mg | INTRAVENOUS | Status: AC
  Administered 2019-08-31: 900 mg via INTRAVENOUS

## 2019-08-31 MED ORDER — INSULIN ASPART 100 UNIT/ML ~~LOC~~ SOLN: 0.0000 [IU] | Freq: Three times a day (TID) | SUBCUTANEOUS | Status: DC

## 2019-08-31 MED ORDER — HYDROMORPHONE HCL 1 MG/ML IJ SOLN: 0.2500 mg | INTRAMUSCULAR | Status: DC | PRN

## 2019-08-31 MED ORDER — SODIUM CHLORIDE 0.9 % IV SOLN: INTRAVENOUS | Status: DC | PRN

## 2019-08-31 MED ORDER — BUPIVACAINE IN DEXTROSE 0.75-8.25 % IT SOLN
INTRATHECAL | Status: DC | PRN
  Administered 2019-08-31: 1.6 mL via INTRATHECAL

## 2019-08-31 MED ORDER — TETANUS-DIPHTH-ACELL PERTUSSIS 5-2.5-18.5 LF-MCG/0.5 IM SUSP: 0.5000 mL | Freq: Once | INTRAMUSCULAR | Status: DC

## 2019-08-31 MED ORDER — PRENATAL MULTIVITAMIN CH
1.0000 | ORAL_TABLET | Freq: Every day | ORAL | Status: DC
  Administered 2019-09-01: 1 via ORAL
  Filled 2019-08-31: qty 1

## 2019-08-31 MED ORDER — NALOXONE HCL 4 MG/10ML IJ SOLN
1.0000 ug/kg/h | INTRAVENOUS | Status: DC | PRN
  Filled 2019-08-31: qty 5

## 2019-08-31 MED ORDER — DIPHENHYDRAMINE HCL 50 MG/ML IJ SOLN: 12.5000 mg | INTRAMUSCULAR | Status: DC | PRN

## 2019-08-31 MED ORDER — KETOROLAC TROMETHAMINE 30 MG/ML IJ SOLN
30.0000 mg | Freq: Once | INTRAMUSCULAR | Status: AC | PRN
  Administered 2019-08-31: 30 mg via INTRAVENOUS

## 2019-08-31 MED ORDER — SODIUM CHLORIDE 0.9% FLUSH: 3.0000 mL | INTRAVENOUS | Status: DC | PRN

## 2019-08-31 MED ORDER — PHENYLEPHRINE HCL-NACL 20-0.9 MG/250ML-% IV SOLN
INTRAVENOUS | Status: AC
  Filled 2019-08-31: qty 250

## 2019-08-31 MED ORDER — OXYCODONE HCL 5 MG/5ML PO SOLN: 5.0000 mg | Freq: Once | ORAL | Status: DC | PRN

## 2019-08-31 MED ORDER — DIBUCAINE (PERIANAL) 1 % EX OINT: 1.0000 "application " | TOPICAL_OINTMENT | CUTANEOUS | Status: DC | PRN

## 2019-08-31 MED ORDER — SIMETHICONE 80 MG PO CHEW
80.0000 mg | CHEWABLE_TABLET | ORAL | Status: DC
  Administered 2019-08-31 – 2019-09-01 (×2): 80 mg via ORAL
  Filled 2019-08-31 (×2): qty 1

## 2019-08-31 MED ORDER — SCOPOLAMINE 1 MG/3DAYS TD PT72
MEDICATED_PATCH | TRANSDERMAL | Status: AC
  Filled 2019-08-31: qty 1

## 2019-08-31 MED ORDER — TRAMADOL HCL 50 MG PO TABS
50.0000 mg | ORAL_TABLET | Freq: Four times a day (QID) | ORAL | Status: DC | PRN
  Administered 2019-09-02: 50 mg via ORAL
  Filled 2019-08-31: qty 1

## 2019-08-31 MED ORDER — SCOPOLAMINE 1 MG/3DAYS TD PT72
1.0000 | MEDICATED_PATCH | Freq: Once | TRANSDERMAL | Status: DC
  Administered 2019-08-31: 1.5 mg via TRANSDERMAL

## 2019-08-31 SURGICAL SUPPLY — 37 items
BENZOIN TINCTURE PRP APPL 2/3 (GAUZE/BANDAGES/DRESSINGS) ×4 IMPLANT
CLAMP CORD UMBIL (MISCELLANEOUS) IMPLANT
CLOTH BEACON ORANGE TIMEOUT ST (SAFETY) ×2 IMPLANT
DERMABOND ADVANCED (GAUZE/BANDAGES/DRESSINGS)
DERMABOND ADVANCED .7 DNX12 (GAUZE/BANDAGES/DRESSINGS) IMPLANT
DRESSING PREVENA PLUS CUSTOM (GAUZE/BANDAGES/DRESSINGS) ×1 IMPLANT
DRSG OPSITE POSTOP 4X10 (GAUZE/BANDAGES/DRESSINGS) ×2 IMPLANT
DRSG PREVENA PLUS CUSTOM (GAUZE/BANDAGES/DRESSINGS) ×2
ELECT REM PT RETURN 9FT ADLT (ELECTROSURGICAL) ×2
ELECTRODE REM PT RTRN 9FT ADLT (ELECTROSURGICAL) ×1 IMPLANT
EXTRACTOR VACUUM M CUP 4 TUBE (SUCTIONS) IMPLANT
GLOVE BIO SURGEON STRL SZ7.5 (GLOVE) ×2 IMPLANT
GLOVE BIOGEL PI IND STRL 7.0 (GLOVE) ×1 IMPLANT
GLOVE BIOGEL PI INDICATOR 7.0 (GLOVE) ×1
GOWN STRL REUS W/TWL LRG LVL3 (GOWN DISPOSABLE) ×4 IMPLANT
KIT ABG SYR 3ML LUER SLIP (SYRINGE) ×2 IMPLANT
KIT PREVENA INCISION MGT20CM45 (CANNISTER) ×2 IMPLANT
NEEDLE HYPO 22GX1.5 SAFETY (NEEDLE) ×2 IMPLANT
NEEDLE HYPO 25X5/8 SAFETYGLIDE (NEEDLE) ×2 IMPLANT
NS IRRIG 1000ML POUR BTL (IV SOLUTION) ×2 IMPLANT
PACK C SECTION WH (CUSTOM PROCEDURE TRAY) ×2 IMPLANT
PAD OB MATERNITY 4.3X12.25 (PERSONAL CARE ITEMS) ×2 IMPLANT
PENCIL SMOKE EVAC W/HOLSTER (ELECTROSURGICAL) ×2 IMPLANT
RETRACTOR TRAXI PANNICULUS (MISCELLANEOUS) ×1 IMPLANT
STRIP CLOSURE SKIN 1/2X4 (GAUZE/BANDAGES/DRESSINGS) IMPLANT
SUT MNCRL 0 VIOLET CTX 36 (SUTURE) ×4 IMPLANT
SUT MONOCRYL 0 CTX 36 (SUTURE) ×8
SUT PDS AB 0 CTX 60 (SUTURE) ×2 IMPLANT
SUT PLAIN 0 NONE (SUTURE) ×2 IMPLANT
SUT PLAIN 2 0 (SUTURE)
SUT PLAIN 2 0 XLH (SUTURE) ×4 IMPLANT
SUT PLAIN ABS 2-0 CT1 27XMFL (SUTURE) IMPLANT
SUT VIC AB 4-0 KS 27 (SUTURE) ×2 IMPLANT
TOWEL OR 17X24 6PK STRL BLUE (TOWEL DISPOSABLE) ×2 IMPLANT
TRAXI PANNICULUS RETRACTOR (MISCELLANEOUS) ×1
TRAY FOLEY W/BAG SLVR 14FR LF (SET/KITS/TRAYS/PACK) ×2 IMPLANT
WATER STERILE IRR 1000ML POUR (IV SOLUTION) ×2 IMPLANT

## 2019-08-31 NOTE — Lactation Note (Signed)
This note was copied from a baby's chart. Lactation Consultation Note  Patient Name: Boy Mehgan Santmyer WFUXN'A Date: 08/31/2019 Reason for consult: Initial assessment;Maternal endocrine disorder;Early term 37-38.6wks Type of Endocrine Disorder?: Thyroid Diabetes  Maternal medication: celexa L2, labetalol L2, Levothryroxine L1, glyburide L2 [facility] Medications &Mothers' Milk  Infant asleep in bassinet.  Mom attempted with first child age 40.  She "ran out of milk" around two weeks and wasn't able to get anything out with a pump.  Mom states infant is doing well and latched well previously.  LC offered to assist with a BF since mom said it had been awhile but mom declined.  She is very tired and preferred rest.  LC provided brochure with lactation phone number and OP information.  She is also aware of support group offerings.   LC reviewed BF basics with mom and encouraged STS and reviewed feeding cues. Spoon provided for hand expression collection.  Mom was encouraged to hand express before and after feedings.   Maternal Data Has patient been taught Hand Expression?: Yes Does the patient have breastfeeding experience prior to this delivery?: Yes  Feeding Feeding Type: Breast Fed  LATCH Score                   Interventions Interventions: Breast feeding basics reviewed  Lactation Tools Discussed/Used     Consult Status Consult Status: Follow-up Date: 09/01/19 Follow-up type: In-patient    Maryruth Hancock [facility] 08/31/2019, 5:18 PM

## 2019-08-31 NOTE — Anesthesia Procedure Notes (Signed)
Spinal  Patient location during procedure: OB Start time: 08/31/2019 7:23 AM End time: 08/31/2019 7:28 AM Staffing Performed: anesthesiologist  Anesthesiologist: Lowella Curb, MD Preanesthetic Checklist Completed: patient identified, IV checked, risks and benefits discussed, surgical consent, monitors and equipment checked, pre-op evaluation and timeout performed Spinal Block Patient position: sitting Prep: DuraPrep and site prepped and draped Patient monitoring: heart rate, cardiac monitor, continuous pulse ox and blood pressure Approach: midline Location: L3-4 Injection technique: single-shot Needle Needle type: Pencan  Needle gauge: 24 G Needle length: 10 cm Assessment Sensory level: T4

## 2019-08-31 NOTE — Progress Notes (Signed)
MOB was referred for history of depression/anxiety. °* Referral screened out by Clinical Social Worker because none of the following criteria appear to apply: °~ History of anxiety/depression during this pregnancy, or of post-partum depression following prior delivery. °~ Diagnosis of anxiety and/or depression within last 3 years °OR °* MOB's symptoms currently being treated with medication and/or therapy. Per further chart review, MOB on Celexa for anxiety.  ° ° °Please contact the Clinical Social Worker if needs arise, by MOB request, or if MOB scores greater than 9/yes to question 10 on Edinburgh Postpartum Depression Screen. ° ° ° °Joene Gelder S. Brenleigh Collet, MSW, LCSW °[facility] °[phone]  °

## 2019-08-31 NOTE — Transfer of Care (Signed)
Immediate Anesthesia Transfer of Care Note  Patient: Donna Marks  Procedure(s) Performed: CESAREAN SECTION WITH BILATERAL TUBAL LIGATION (N/A )  Patient Location: PACU  Anesthesia Type:Spinal  Level of Consciousness: awake, alert  and oriented  Airway & Oxygen Therapy: Patient Spontanous Breathing and Patient connected to nasal cannula oxygen  Post-op Assessment: Report given to RN and Post -op Vital signs reviewed and stable  Post vital signs: Reviewed and stable  Last Vitals:  Vitals Value Taken Time  BP 118/84 08/31/19 0855  Temp    Pulse 64 08/31/19 0856  Resp 20 08/31/19 0856  SpO2 100 % 08/31/19 0856  Vitals shown include unvalidated device data.  Last Pain:  Vitals:   08/31/19 0617  TempSrc: Oral         Complications: No apparent anesthesia complications

## 2019-08-31 NOTE — Brief Op Note (Signed)
08/31/2019  8:28 AM  PATIENT:  Donna Marks  39 y.o. female  PRE-OPERATIVE DIAGNOSIS:  previous X1, desires sterilization  POST-OPERATIVE DIAGNOSIS:  previous X1, desires sterilization  PROCEDURE:  Procedure(s) with comments: CESAREAN SECTION WITH BILATERAL TUBAL LIGATION (N/A) - Repeat edc 09/13/19 allergy to amoxicillin, ceftin, codeine Heather, RNFA  SURGEON:  Surgeon(s) and Role:    * Harold Hedge, MD - Primary  PHYSICIAN ASSISTANT:   ASSISTANTS: Metallurgist RNFA   ANESTHESIA:   spinal  EBL:  Per anesthesiology note   BLOOD ADMINISTERED:none  DRAINS: Urinary Catheter (Foley) and wound vac   LOCAL MEDICATIONS USED:  NONE  SPECIMEN:  Source of Specimen:  placenta  DISPOSITION OF SPECIMEN:  PATHOLOGY  COUNTS:  YES  TOURNIQUET:  * No tourniquets in log *  DICTATION: .Other Dictation: Dictation Number [id number]  PLAN OF CARE: Admit to inpatient   PATIENT DISPOSITION:  PACU - hemodynamically stable.   Delay start of Pharmacological VTE agent (>24hrs) due to surgical blood loss or risk of bleeding: not applicable

## 2019-08-31 NOTE — Progress Notes (Addendum)
Inpatient Diabetes Program Recommendations  AACE/ADA: New Consensus Statement on Inpatient Glycemic Control (2015)  Target Ranges:  Prepandial:   less than 140 mg/dL      Peak postprandial:   less than 180 mg/dL (1-2 hours)      Critically ill patients:  140 - 180 mg/dL   Lab Results  Component Value Date   GLUCAP 113 (H) 08/31/2019    Review of Glycemic Control  Diabetes history: Type 2 Outpatient Diabetes medications: (during pregnancy) Levemir 70 units daily, Humalog 5 units at suppertime. (prepregnancy) Evaristo Bury 40 units daily, Metformin 1000 mg BID Current orders for Inpatient glycemic control: none yet  Inpatient Diabetes Program Recommendations:   Received diabetes coordinator consult. Spoke with patient on the phone to ask her about her prepregnancy insulin dosages. States that she was taking [place] 40 units daily. Also was taking Metformin 1000 mg BID.   Recommend starting Levemir 20 units daily, Novolog SENSITIVE correction scale TID & HS scale.  Use glycemic control order set (SSI) AC & HS scale for new orders.   Will need AC & HS CBGs done while in the hospital. Discontinue diabetes pregnancy order set.    Smith Mince RN BSN CDE Diabetes Coordinator Pager: [phone]  8am-5pm

## 2019-08-31 NOTE — Progress Notes (Signed)
No change to H&P per patient history Reviewed with patient procedure-repeat cesarean section and bilateral tubal ligation. Reviewed risks including infection, organ damage, bleeding/transfusion-HIV/Hep, DVT/PE, pneumonia, wound break down. D/W BTL and permanence, alternatives, failure rate, increased ectopic risk. She states she understands and agrees.

## 2019-08-31 NOTE — Anesthesia Postprocedure Evaluation (Signed)
Anesthesia Post Note  Patient: Donna Marks  Procedure(s) Performed: CESAREAN SECTION WITH BILATERAL TUBAL LIGATION (N/A )     Patient location during evaluation: PACU Anesthesia Type: Spinal Level of consciousness: awake and alert Pain management: pain level controlled Vital Signs Assessment: post-procedure vital signs reviewed and stable Respiratory status: spontaneous breathing, nonlabored ventilation and respiratory function stable Cardiovascular status: blood pressure returned to baseline and stable Postop Assessment: no apparent nausea or vomiting Anesthetic complications: no    Last Vitals:  Vitals:   08/31/19 1000 08/31/19 1020  BP: 111/81 123/71  Pulse: (!) 59 61  Resp: 15 16  Temp: 36.6 C   SpO2: 100% 99%    Last Pain:  Vitals:   08/31/19 1020  TempSrc:   PainSc: (P) 0-No pain   Pain Goal:    LLE Motor Response: Purposeful movement (08/31/19 1000)   RLE Motor Response: Purposeful movement (08/31/19 1000)       Epidural/Spinal Function Cutaneous sensation: (P) Tingles (08/31/19 1020), Patient able to flex knees: Yes (08/31/19 1000), Patient able to lift hips off bed: No (08/31/19 1000), Back pain beyond tenderness at insertion site: No (08/31/19 1000), Progressively worsening motor and/or sensory loss: No (08/31/19 1000), Bowel and/or bladder incontinence post epidural: No (08/31/19 1000)  Lowella Curb

## 2019-08-31 NOTE — Op Note (Signed)
Donna Marks, NADER MEDICAL RECORD [id number] ACCOUNT [id number] DATE OF BIRTH:[birth year] FACILITY: [facility] LOCATION: [place], [place]  OPERATIVE REPORT  DATE OF PROCEDURE:  08/31/2019  PREOPERATIVE DIAGNOSES: 1.  Intrauterine pregnancy at 72 and 1/7 weeks. 2.  Diabetes. 3.  Hypertension. 4.  Previous cesarean section, desires repeat. 5.  Desires permanent sterilization.  POSTOPERATIVE DIAGNOSES:   1.  Intrauterine pregnancy at 77 and 1/7 weeks. 2.  Diabetes. 3.  Hypertension. 4.  Previous cesarean section, desires repeat. 5.  Desires permanent sterilization.  PROCEDURES:   1.  Repeat low transverse cesarean section. 2.  Bilateral tubal ligation.  SURGEON:  Everlene Farrier, MD   ASSISTANT:  Claretta Fraise, RNFA  SPECIMENS:  Placenta to pathology.  ESTIMATED BLOOD LOSS:  Per anesthesia note.  OPERATIVE FINDINGS:  Viable female infant.  Apgars, arterial cord pH, birth weight pending.  INDICATIONS AND CONSENT:  This patient is a 39 year old G3 P1 at 56 and 1/7 weeks.  Prenatal care was complicated by advanced maternal age, hypertension on labetalol 200 mg b.i.d., type 2 diabetes with initially poor control, now on metformin and insulin  per endocrinology, and hypothyroidism.  She has previous cesarean section and desires repeat.  She also desires permanent sterilization.  Potential risks and complications have been discussed preoperatively, including but not limited to infection, organ  damage, bleeding requiring transfusion of blood products with HIV and hepatitis acquisition, DVT, PE, pneumonia, wound separation.  Tubal ligation alternative methods of contraception, permanence of the procedure, failure rate and increased ectopic risk  have also been discussed.  The patient states she understands and agrees and consent was signed on the chart.  DESCRIPTION OF PROCEDURE:  The patient was taken to the operating room where she was identified and  spinal anesthetic was placed per anesthesiology.  She was placed in the dorsal supine position with a 15-degree left lateral wedge.  She was prepped  vaginally with Betadine.  Foley catheter was placed in the bladder and she was prepped abdominally with ChloraPrep.  Tract C was also used to elevate the panniculus.  After a 3-minute drying time, timeout was done and she was draped in a sterile fashion.   After testing for adequate spinal anesthesia, skin was entered through the previous Pfannenstiel scar and dissection was carried out in layers to the peritoneum, which was taken down superiorly and inferiorly.  Vesicouterine peritoneum was taken down  cephalad laterally, bladder flap developed and the bladder blade was placed.  Uterus was incised in a low transverse manner.  The uterine cavity was entered bluntly with a hemostat.  Clear fluid was noted.  The uterine incision was extended with fingers.   Vertex was then delivered.  Loose nuchal cord x1 was reduced and the baby was completely delivered.  Cry and tone was noted.  After 1 minute, cord was clamped and cut and the baby was handed to waiting pediatrics team.  Placenta was manually delivered.   Cavity was clean.  Uterus was closed in 2 running locking imbricating layers of 0 Monocryl suture, which achieved good hemostasis.  Left fallopian tube was identified from cornu to fimbria.  It was grasped in its mid ampullary portion and a knuckle of  fallopian tube was doubly ligated with 2 free ties of plain suture.  The intervening knuckle was then sharply resected.  Similar procedure was carried out on the right side.  Ovaries were normal bilaterally.  Lavage was carried out and returned as clear.   Anterior  peritoneum was closed in a running fashion with 0 Monocryl suture, which was also used to reapproximate the pyramidalis muscle in the midline.  Anterior rectus fascia was closed in a running fashion with a 0 looped PDS.  Subcutaneous layer was  closed  with interrupted plain and the skin was closed in a subcuticular fashion with 4-0 Vicryl on a Keith needle.  A wound VAC was applied.  Dressings were applied.  All counts were correct and the patient was taken to recovery room in stable  condition.  VN/NUANCE  D:08/31/2019 T:08/31/2019 JOB:[id number]

## 2019-08-31 NOTE — Progress Notes (Signed)
Inpatient Diabetes Program Recommendations  AACE/ADA: New Consensus Statement on Inpatient Glycemic Control (2015)  Target Ranges:  Prepandial:   less than 140 mg/dL      Peak postprandial:   less than 180 mg/dL (1-2 hours)      Critically ill patients:  140 - 180 mg/dL   Lab Results  Component Value Date   GLUCAP 113 (H) 08/31/2019    Review of Glycemic Control  Diabetes history:Type 2 Outpatient Diabetes medications: Levemir 70 U at HS, Humalog 5 U at supper, Metformin 1000 mg BID Current orders for Inpatient glycemic control: none  Inpatient Diabetes Program Recommendations:    Received diabetes coordinator consult. Noted that CBGs are within normal limits. Will continue to monitor blood sugars while in the hospital.  Smith Mince RN BSN CDE Diabetes Coordinator Pager: [phone]  8am-5pm

## 2019-08-31 NOTE — Anesthesia Preprocedure Evaluation (Signed)
Anesthesia Evaluation  Patient identified by MRN, date of birth, ID band Patient awake    Reviewed: Allergy & Precautions, NPO status , Patient's Chart, lab work & pertinent test results  Airway Mallampati: II  TM Distance: >3 FB Neck ROM: Full    Dental no notable dental hx.    Pulmonary neg pulmonary ROS,    Pulmonary exam normal breath sounds clear to auscultation       Cardiovascular hypertension, negative cardio ROS Normal cardiovascular exam Rhythm:Regular Rate:Normal     Neuro/Psych negative neurological ROS  negative psych ROS   GI/Hepatic Neg liver ROS, GERD  ,  Endo/Other  diabetesMorbid obesity  Renal/GU negative Renal ROS  negative genitourinary   Musculoskeletal negative musculoskeletal ROS (+)   Abdominal (+) + obese,   Peds negative pediatric ROS (+)  Hematology negative hematology ROS (+)   Anesthesia Other Findings   Reproductive/Obstetrics (+) Pregnancy                             Anesthesia Physical Anesthesia Plan  ASA: III  Anesthesia Plan: Spinal   Post-op Pain Management:    Induction:   PONV Risk Score and Plan: 2 and Treatment may vary due to age or medical condition  Airway Management Planned: Natural Airway  Additional Equipment:   Intra-op Plan:   Post-operative Plan:   Informed Consent: I have reviewed the patients History and Physical, chart, labs and discussed the procedure including the risks, benefits and alternatives for the proposed anesthesia with the patient or authorized representative who has indicated his/her understanding and acceptance.     Dental advisory given  Plan Discussed with: CRNA  Anesthesia Plan Comments:         Anesthesia Quick Evaluation

## 2019-09-01 LAB — CBC
HCT: 31.2 % — ABNORMAL LOW (ref 36.0–46.0)
Hemoglobin: 9.9 g/dL — ABNORMAL LOW (ref 12.0–15.0)
MCH: 26.6 pg (ref 26.0–34.0)
MCHC: 31.7 g/dL (ref 30.0–36.0)
MCV: 83.9 fL (ref 80.0–100.0)
Platelets: 124 10*3/uL — ABNORMAL LOW (ref 150–400)
RBC: 3.72 MIL/uL — ABNORMAL LOW (ref 3.87–5.11)
RDW: 18.1 % — ABNORMAL HIGH (ref 11.5–15.5)
WBC: 6.3 10*3/uL (ref 4.0–10.5)
nRBC: 0 % (ref 0.0–0.2)

## 2019-09-01 LAB — SURGICAL PATHOLOGY

## 2019-09-01 LAB — GLUCOSE, CAPILLARY
Glucose-Capillary: 125 mg/dL — ABNORMAL HIGH (ref 70–99)
Glucose-Capillary: 98 mg/dL (ref 70–99)
Glucose-Capillary: 91 mg/dL (ref 70–99)
Glucose-Capillary: 152 mg/dL — ABNORMAL HIGH (ref 70–99)

## 2019-09-01 NOTE — Progress Notes (Signed)
POD # 1   Doing well.  BP 125/76 (BP Location: Left Arm)   Pulse 67   Temp 98.3 F (36.8 C) (Oral)   Resp 15   Ht 5\' 9"  (1.753 m)   Wt 131.1 kg   SpO2 100%   Breastfeeding Unknown   BMI 42.68 kg/m  Results for orders placed or performed during the hospital encounter of 08/31/19 (from the past 24 hour(s))  Glucose, capillary     Status: Abnormal   Collection Time: 08/31/19  8:57 AM  Result Value Ref Range   Glucose-Capillary 113 (H) 70 - 99 mg/dL  Hemoglobin 09/02/19     Status: Abnormal   Collection Time: 08/31/19  4:29 PM  Result Value Ref Range   Hgb A1c MFr Bld 6.7 (H) 4.8 - 5.6 %   Mean Plasma Glucose 145.59 mg/dL  Glucose, capillary     Status: Abnormal   Collection Time: 08/31/19  6:38 PM  Result Value Ref Range   Glucose-Capillary 112 (H) 70 - 99 mg/dL  Glucose, capillary     Status: Abnormal   Collection Time: 08/31/19 10:46 PM  Result Value Ref Range   Glucose-Capillary 119 (H) 70 - 99 mg/dL  CBC     Status: Abnormal   Collection Time: 09/01/19  5:34 AM  Result Value Ref Range   WBC 6.3 4.0 - 10.5 K/uL   RBC 3.72 (L) 3.87 - 5.11 MIL/uL   Hemoglobin 9.9 (L) 12.0 - 15.0 g/dL   HCT 09/03/19 (L) 86.5 - 78.4 %   MCV 83.9 80.0 - 100.0 fL   MCH 26.6 26.0 - 34.0 pg   MCHC 31.7 30.0 - 36.0 g/dL   RDW 69.6 (H) 29.5 - 28.4 %   Platelets 124 (L) 150 - 400 K/uL   nRBC 0.0 0.0 - 0.2 %  Glucose, capillary     Status: Abnormal   Collection Time: 09/01/19  6:03 AM  Result Value Ref Range   Glucose-Capillary 125 (H) 70 - 99 mg/dL   Scheduled Meds: . citalopram  20 mg Oral Daily  . insulin aspart  0-9 Units Subcutaneous TID WC  . insulin detemir  20 Units Subcutaneous QHS  . labetalol  200 mg Oral BID  . levothyroxine  250 mcg Oral QAC breakfast  . prenatal multivitamin  1 tablet Oral Q1200  . scopolamine  1 patch Transdermal Once  . senna-docusate  2 tablet Oral Q24H  . simethicone  80 mg Oral TID PC  . simethicone  80 mg Oral Q24H  . Tdap  0.5 mL Intramuscular Once    Continuous Infusions: . lactated ringers 125 mL/hr at 09/01/19 0608  . naLOXone [facility]) adult infusion for PRURITIS     PRN Meds:.acetaminophen, coconut oil, witch hazel-glycerin **AND** dibucaine, diphenhydrAMINE **OR** diphenhydrAMINE, HYDROmorphone (DILAUDID) injection, ibuprofen, menthol-cetylpyridinium, nalbuphine **OR** nalbuphine, nalbuphine **OR** nalbuphine, naloxone **AND** sodium chloride flush, naLOXone (NARCAN) adult infusion for PRURITIS, ondansetron (ZOFRAN) IV, simethicone, traMADol, zolpidem Abdomen is soft and non tender Wound vac in place  POD # 1  Doing well Remove wound vac tomorrow DM under good control Continue labetalol circ today

## 2019-09-01 NOTE — Lactation Note (Signed)
This note was copied from a baby's chart. Lactation Consultation Note  Patient Name: Donna Marks OHCOB'T Date: 09/01/2019  Baby Donna Donna Marks no 31 hours old. Parents report he breastfed well a few times past delivery.  Then he got upset and would not latch.  Offered to assist with breastfeeding or get mom pumping.  Mom declined.  Mom reports she has DEBP at home that she may start to pump when goes home.  Urged parents to call lactation as needed.   Maternal Data    Feeding    LATCH Score                   Interventions    Lactation Tools Discussed/Used     Consult Status      Neomia Dear 09/01/2019, 4:42 PM

## 2019-09-02 LAB — GLUCOSE, CAPILLARY: Glucose-Capillary: 109 mg/dL — ABNORMAL HIGH (ref 70–99)

## 2019-09-02 MED ORDER — IBUPROFEN 600 MG PO TABS: 600.0000 mg | ORAL_TABLET | Freq: Four times a day (QID) | ORAL | 0 refills | Status: AC | PRN

## 2019-09-02 MED ORDER — TRAMADOL HCL 50 MG PO TABS: 50.0000 mg | ORAL_TABLET | Freq: Four times a day (QID) | ORAL | 0 refills | Status: AC | PRN

## 2019-09-02 NOTE — Discharge Summary (Signed)
Postpartum Discharge Summary  Date of Service May 22/2021     Patient Name: Donna Marks DOB: 1981/02/21 MRN: [id number]  Date of admission: 08/31/2019 Delivery date:08/31/2019  Delivering provider: Everlene Farrier  Date of discharge: 09/02/2019  Admitting diagnosis: Pregnant and not yet delivered in third trimester [Z34.93] Intrauterine pregnancy: [redacted]w[redacted]d    Secondary diagnosis:  Active Problems:   Pregnant and not yet delivered in third trimester  Additional problems: Diabetes and Chronic Hypertension    Discharge diagnosis: Term Pregnancy Delivered, CHTN and Type 2 DM                                              Post partum procedure:  None Augmentation: N/A Complications: None  Hospital course: Sceduled C/S   39y.o. yo G3P2011 at 327w1das admitted to the hospital 08/31/2019 for scheduled cesarean section with the following indication:Elective Repeat.Delivery details are as follows:  Membrane Rupture Time/Date: 7:52 AM ,08/31/2019   Delivery Method:C-Section, Low Transverse  Details of operation can be found in separate operative note.  Patient had an uncomplicated postpartum course.  She is ambulating, tolerating a regular diet, passing flatus, and urinating well. Patient is discharged home in stable condition on  09/02/19        Newborn Data: Birth date:08/31/2019  Birth time:7:53 AM  Gender:Female  Living status:Living  Apgars:8 ,9  Weight:4920 g     Magnesium Sulfate received: No BMZ received: No Rhophylac:No MMR:No T-DaP:Given prenatally Flu: No Transfusion:No  Physical exam  Vitals:   09/01/19 1424 09/01/19 2114 09/01/19 2255 09/02/19 0606  BP: (!) 126/56 (!) 145/72 (!) 144/63 (!) 145/67  Pulse: (!) 55 72 63 (!) 59  Resp: '18 16 16 16  ' Temp: 98.8 F (37.1 C) 98.9 F (37.2 C)  98.3 F (36.8 C)  TempSrc: Oral Oral  Oral  SpO2:  99% 100% 100%  Weight:      Height:       General: alert Lochia: appropriate Uterine Fundus: firm Incision: Healing well  with no significant drainage DVT Evaluation: No evidence of DVT seen on physical exam. Labs: Lab Results  Component Value Date   WBC 6.3 09/01/2019   HGB 9.9 (L) 09/01/2019   HCT 31.2 (L) 09/01/2019   MCV 83.9 09/01/2019   PLT 124 (L) 09/01/2019   CMP Latest Ref Rng & Units 04/05/2008  Glucose 70 - 99 mg/dL 158(H)  BUN 6 - 23 mg/dL 14  Creatinine 0.4 - 1.2 mg/dL 0.90  Sodium 135 - 145 mEq/L 139  Potassium 3.5 - 5.1 mEq/L 3.8  Chloride 96 - 112 mEq/L 103  CO2 19 - 32 mEq/L 25  Calcium 8.4 - 10.5 mg/dL 9.6   Edinburgh Score: Edinburgh Postnatal Depression Scale Screening Tool 09/01/2019  I have been able to laugh and see the funny side of things. 0  I have looked forward with enjoyment to things. 0  I have blamed myself unnecessarily when things went wrong. 1  I have been anxious or worried for no good reason. 2  I have felt scared or panicky for no good reason. 0  Things have been getting on top of me. 1  I have been so unhappy that I have had difficulty sleeping. 0  I have felt sad or miserable. 0  I have been so unhappy that I have been crying. 1  The thought of harming myself has occurred to me. 0  Edinburgh Postnatal Depression Scale Total 5      After visit meds:  Allergies as of 09/02/2019      Reactions   Amoxil [amoxicillin] Nausea And Vomiting   Ceftin [cefuroxime Axetil] Nausea And Vomiting   Codeine Nausea And Vomiting      Medication List    STOP taking these medications   aspirin 81 MG chewable tablet     TAKE these medications   acetaminophen 500 MG tablet Commonly known as: TYLENOL Take 1,000 mg by mouth every 8 (eight) hours as needed for moderate pain or headache.   citalopram 20 MG tablet Commonly known as: CELEXA Take 20 mg by mouth daily.   ibuprofen 600 MG tablet Commonly known as: ADVIL Take 1 tablet (600 mg total) by mouth every 6 (six) hours as needed for moderate pain.   insulin lispro 100 UNIT/ML injection Commonly known as:  HUMALOG Inject 5 Units into the skin daily with supper.   labetalol 200 MG tablet Commonly known as: NORMODYNE Take 200 mg by mouth 2 (two) times daily.   Levemir FlexTouch 100 UNIT/ML FlexPen Generic drug: insulin detemir Inject 70 Units into the skin every evening.   levothyroxine 125 MCG tablet Commonly known as: SYNTHROID Take 250 mcg by mouth daily before breakfast.   metFORMIN 1000 MG tablet Commonly known as: GLUCOPHAGE Take 1,000 mg by mouth 2 (two) times daily with a meal.   omeprazole 20 MG tablet Commonly known as: PRILOSEC OTC Take 20 mg by mouth daily.   PRENATAL PO Take 1 tablet by mouth daily.   traMADol 50 MG tablet Commonly known as: ULTRAM Take 1 tablet (50 mg total) by mouth every 6 (six) hours as needed for severe pain.        Discharge home in stable condition Infant Feeding: Breast Infant Disposition:home with mother Discharge instruction: per After Visit Summary and Postpartum booklet. Activity: Advance as tolerated. Pelvic rest for 6 weeks.  Diet: carb modified diet Anticipated Birth Control: Unsure Postpartum Appointment:1 week Additional Postpartum F/U: Incision check 1 week Future Appointments:No future appointments. Follow up Visit:      09/02/2019 Cyril Mourning, MD

## 2019-09-02 NOTE — Lactation Note (Addendum)
This note was copied from a baby's chart. Lactation Consultation Note  Patient Name: Donna Marks [id number] Date: 09/02/2019   Infant is 48 hrs old. Mom has declined LC assistance on 2 separate occasions. Per chart, Mom has not put infant to breast in 36 hrs & has only been formula feeding via bottle. This LC let RN know that lactation won't visit prior to discharge unless RN lets me know otherwise.  Lurline Hare [facility] 09/02/2019, 8:21 AM  [phone]: RN says Mom does not want to see lactation; Mom is planning to formula feed only.  Glenetta Hew, RN, IBCLC
